# Patient Record
Sex: Female | Born: 1980 | Race: White | Hispanic: No | Marital: Married | Smoking: Former smoker
Health system: Southern US, Community
[De-identification: ages and names within clinical notes are randomized; demographics above are authoritative.]

## PROBLEM LIST (undated history)

## (undated) DIAGNOSIS — F419 Anxiety disorder, unspecified: Secondary | ICD-10-CM

## (undated) DIAGNOSIS — G43909 Migraine, unspecified, not intractable, without status migrainosus: Secondary | ICD-10-CM

## (undated) HISTORY — PX: NO PAST SURGERIES: SHX2092

## (undated) HISTORY — PX: WISDOM TOOTH EXTRACTION: SHX21

---

## 2014-05-21 ENCOUNTER — Other Ambulatory Visit: Payer: Self-pay | Admitting: Physician Assistant

## 2014-05-21 ENCOUNTER — Ambulatory Visit
Admission: RE | Admit: 2014-05-21 | Discharge: 2014-05-21 | Disposition: A | Discharge status: Home / Self Care | Payer: BC Managed Care – PPO | Source: Ambulatory Visit | Attending: Physician Assistant | Admitting: Physician Assistant

## 2014-05-21 DIAGNOSIS — M542 Cervicalgia: Secondary | ICD-10-CM

## 2015-05-02 ENCOUNTER — Other Ambulatory Visit: Payer: Self-pay | Admitting: Pain Medicine

## 2015-05-02 DIAGNOSIS — M545 Low back pain: Secondary | ICD-10-CM

## 2015-05-02 DIAGNOSIS — M542 Cervicalgia: Secondary | ICD-10-CM

## 2015-09-23 DIAGNOSIS — M25512 Pain in left shoulder: Secondary | ICD-10-CM | POA: Diagnosis not present

## 2015-10-09 DIAGNOSIS — M25512 Pain in left shoulder: Secondary | ICD-10-CM | POA: Diagnosis not present

## 2015-10-14 DIAGNOSIS — M25519 Pain in unspecified shoulder: Secondary | ICD-10-CM | POA: Diagnosis not present

## 2015-10-14 DIAGNOSIS — Z79891 Long term (current) use of opiate analgesic: Secondary | ICD-10-CM | POA: Diagnosis not present

## 2015-10-14 DIAGNOSIS — M542 Cervicalgia: Secondary | ICD-10-CM | POA: Diagnosis not present

## 2015-10-14 DIAGNOSIS — G43909 Migraine, unspecified, not intractable, without status migrainosus: Secondary | ICD-10-CM | POA: Diagnosis not present

## 2015-10-14 DIAGNOSIS — G894 Chronic pain syndrome: Secondary | ICD-10-CM | POA: Diagnosis not present

## 2015-10-14 DIAGNOSIS — Z79899 Other long term (current) drug therapy: Secondary | ICD-10-CM | POA: Diagnosis not present

## 2015-10-15 DIAGNOSIS — M25512 Pain in left shoulder: Secondary | ICD-10-CM | POA: Diagnosis not present

## 2015-10-30 DIAGNOSIS — M25512 Pain in left shoulder: Secondary | ICD-10-CM | POA: Diagnosis not present

## 2015-11-11 DIAGNOSIS — M25519 Pain in unspecified shoulder: Secondary | ICD-10-CM | POA: Diagnosis not present

## 2015-11-11 DIAGNOSIS — G894 Chronic pain syndrome: Secondary | ICD-10-CM | POA: Diagnosis not present

## 2015-11-11 DIAGNOSIS — Z79891 Long term (current) use of opiate analgesic: Secondary | ICD-10-CM | POA: Diagnosis not present

## 2015-11-11 DIAGNOSIS — Z79899 Other long term (current) drug therapy: Secondary | ICD-10-CM | POA: Diagnosis not present

## 2015-11-11 DIAGNOSIS — M542 Cervicalgia: Secondary | ICD-10-CM | POA: Diagnosis not present

## 2015-11-11 DIAGNOSIS — M545 Low back pain: Secondary | ICD-10-CM | POA: Diagnosis not present

## 2015-12-11 DIAGNOSIS — Z79891 Long term (current) use of opiate analgesic: Secondary | ICD-10-CM | POA: Diagnosis not present

## 2015-12-11 DIAGNOSIS — M545 Low back pain: Secondary | ICD-10-CM | POA: Diagnosis not present

## 2015-12-11 DIAGNOSIS — R52 Pain, unspecified: Secondary | ICD-10-CM | POA: Diagnosis not present

## 2015-12-11 DIAGNOSIS — M542 Cervicalgia: Secondary | ICD-10-CM | POA: Diagnosis not present

## 2015-12-11 DIAGNOSIS — Z79899 Other long term (current) drug therapy: Secondary | ICD-10-CM | POA: Diagnosis not present

## 2015-12-11 DIAGNOSIS — G894 Chronic pain syndrome: Secondary | ICD-10-CM | POA: Diagnosis not present

## 2015-12-18 DIAGNOSIS — J309 Allergic rhinitis, unspecified: Secondary | ICD-10-CM | POA: Diagnosis not present

## 2015-12-18 DIAGNOSIS — M25549 Pain in joints of unspecified hand: Secondary | ICD-10-CM | POA: Diagnosis not present

## 2015-12-18 DIAGNOSIS — G43909 Migraine, unspecified, not intractable, without status migrainosus: Secondary | ICD-10-CM | POA: Diagnosis not present

## 2016-01-23 DIAGNOSIS — G894 Chronic pain syndrome: Secondary | ICD-10-CM | POA: Diagnosis not present

## 2016-01-23 DIAGNOSIS — M545 Low back pain: Secondary | ICD-10-CM | POA: Diagnosis not present

## 2016-01-23 DIAGNOSIS — G43909 Migraine, unspecified, not intractable, without status migrainosus: Secondary | ICD-10-CM | POA: Diagnosis not present

## 2016-01-23 DIAGNOSIS — M797 Fibromyalgia: Secondary | ICD-10-CM | POA: Diagnosis not present

## 2016-03-31 DIAGNOSIS — G894 Chronic pain syndrome: Secondary | ICD-10-CM | POA: Diagnosis not present

## 2016-03-31 DIAGNOSIS — M2550 Pain in unspecified joint: Secondary | ICD-10-CM | POA: Diagnosis not present

## 2016-03-31 DIAGNOSIS — M791 Myalgia: Secondary | ICD-10-CM | POA: Diagnosis not present

## 2016-05-07 ENCOUNTER — Other Ambulatory Visit (HOSPITAL_COMMUNITY)
Admission: RE | Admit: 2016-05-07 | Discharge: 2016-05-07 | Disposition: A | Discharge status: Home / Self Care | Payer: BLUE CROSS/BLUE SHIELD | Source: Ambulatory Visit | Attending: Obstetrics & Gynecology | Admitting: Obstetrics & Gynecology

## 2016-05-07 ENCOUNTER — Other Ambulatory Visit: Payer: Self-pay | Admitting: Obstetrics & Gynecology

## 2016-05-07 DIAGNOSIS — Z1151 Encounter for screening for human papillomavirus (HPV): Secondary | ICD-10-CM | POA: Insufficient documentation

## 2016-05-07 DIAGNOSIS — Z01419 Encounter for gynecological examination (general) (routine) without abnormal findings: Secondary | ICD-10-CM | POA: Diagnosis not present

## 2016-05-12 LAB — CYTOLOGY - PAP
Diagnosis: NEGATIVE
HPV (WINDOPATH): NOT DETECTED

## 2016-08-13 DIAGNOSIS — G43909 Migraine, unspecified, not intractable, without status migrainosus: Secondary | ICD-10-CM | POA: Diagnosis not present

## 2016-08-13 DIAGNOSIS — F411 Generalized anxiety disorder: Secondary | ICD-10-CM | POA: Diagnosis not present

## 2016-08-13 DIAGNOSIS — G8929 Other chronic pain: Secondary | ICD-10-CM | POA: Diagnosis not present

## 2016-12-10 DIAGNOSIS — T22211A Burn of second degree of right forearm, initial encounter: Secondary | ICD-10-CM | POA: Diagnosis not present

## 2017-07-16 DIAGNOSIS — G43909 Migraine, unspecified, not intractable, without status migrainosus: Secondary | ICD-10-CM | POA: Diagnosis not present

## 2017-07-16 DIAGNOSIS — F411 Generalized anxiety disorder: Secondary | ICD-10-CM | POA: Diagnosis not present

## 2017-10-26 DIAGNOSIS — R21 Rash and other nonspecific skin eruption: Secondary | ICD-10-CM | POA: Diagnosis not present

## 2018-01-14 DIAGNOSIS — G43909 Migraine, unspecified, not intractable, without status migrainosus: Secondary | ICD-10-CM | POA: Diagnosis not present

## 2018-01-14 DIAGNOSIS — F411 Generalized anxiety disorder: Secondary | ICD-10-CM | POA: Diagnosis not present

## 2018-02-02 DIAGNOSIS — M545 Low back pain: Secondary | ICD-10-CM | POA: Diagnosis not present

## 2018-02-08 DIAGNOSIS — M545 Low back pain: Secondary | ICD-10-CM | POA: Diagnosis not present

## 2018-04-28 DIAGNOSIS — M5442 Lumbago with sciatica, left side: Secondary | ICD-10-CM | POA: Diagnosis not present

## 2018-05-26 DIAGNOSIS — M5442 Lumbago with sciatica, left side: Secondary | ICD-10-CM | POA: Diagnosis not present

## 2018-06-07 DIAGNOSIS — M2560 Stiffness of unspecified joint, not elsewhere classified: Secondary | ICD-10-CM | POA: Diagnosis not present

## 2018-06-07 DIAGNOSIS — M545 Low back pain: Secondary | ICD-10-CM | POA: Diagnosis not present

## 2018-06-07 DIAGNOSIS — M799 Soft tissue disorder, unspecified: Secondary | ICD-10-CM | POA: Diagnosis not present

## 2018-06-07 DIAGNOSIS — M5416 Radiculopathy, lumbar region: Secondary | ICD-10-CM | POA: Diagnosis not present

## 2018-06-09 DIAGNOSIS — M2560 Stiffness of unspecified joint, not elsewhere classified: Secondary | ICD-10-CM | POA: Diagnosis not present

## 2018-06-09 DIAGNOSIS — M799 Soft tissue disorder, unspecified: Secondary | ICD-10-CM | POA: Diagnosis not present

## 2018-06-09 DIAGNOSIS — M5416 Radiculopathy, lumbar region: Secondary | ICD-10-CM | POA: Diagnosis not present

## 2018-06-09 DIAGNOSIS — M545 Low back pain: Secondary | ICD-10-CM | POA: Diagnosis not present

## 2018-06-12 DIAGNOSIS — M2560 Stiffness of unspecified joint, not elsewhere classified: Secondary | ICD-10-CM | POA: Diagnosis not present

## 2018-06-12 DIAGNOSIS — M799 Soft tissue disorder, unspecified: Secondary | ICD-10-CM | POA: Diagnosis not present

## 2018-06-12 DIAGNOSIS — M545 Low back pain: Secondary | ICD-10-CM | POA: Diagnosis not present

## 2018-06-12 DIAGNOSIS — M5416 Radiculopathy, lumbar region: Secondary | ICD-10-CM | POA: Diagnosis not present

## 2018-06-17 DIAGNOSIS — M799 Soft tissue disorder, unspecified: Secondary | ICD-10-CM | POA: Diagnosis not present

## 2018-06-17 DIAGNOSIS — M5416 Radiculopathy, lumbar region: Secondary | ICD-10-CM | POA: Diagnosis not present

## 2018-06-17 DIAGNOSIS — M2560 Stiffness of unspecified joint, not elsewhere classified: Secondary | ICD-10-CM | POA: Diagnosis not present

## 2018-06-17 DIAGNOSIS — M545 Low back pain: Secondary | ICD-10-CM | POA: Diagnosis not present

## 2018-06-20 DIAGNOSIS — M5416 Radiculopathy, lumbar region: Secondary | ICD-10-CM | POA: Diagnosis not present

## 2018-06-20 DIAGNOSIS — M799 Soft tissue disorder, unspecified: Secondary | ICD-10-CM | POA: Diagnosis not present

## 2018-06-20 DIAGNOSIS — M545 Low back pain: Secondary | ICD-10-CM | POA: Diagnosis not present

## 2018-06-20 DIAGNOSIS — M2560 Stiffness of unspecified joint, not elsewhere classified: Secondary | ICD-10-CM | POA: Diagnosis not present

## 2018-06-24 DIAGNOSIS — M545 Low back pain: Secondary | ICD-10-CM | POA: Diagnosis not present

## 2018-06-24 DIAGNOSIS — M5416 Radiculopathy, lumbar region: Secondary | ICD-10-CM | POA: Diagnosis not present

## 2018-06-24 DIAGNOSIS — M799 Soft tissue disorder, unspecified: Secondary | ICD-10-CM | POA: Diagnosis not present

## 2018-06-24 DIAGNOSIS — M2560 Stiffness of unspecified joint, not elsewhere classified: Secondary | ICD-10-CM | POA: Diagnosis not present

## 2018-06-29 DIAGNOSIS — M545 Low back pain: Secondary | ICD-10-CM | POA: Diagnosis not present

## 2018-06-29 DIAGNOSIS — M799 Soft tissue disorder, unspecified: Secondary | ICD-10-CM | POA: Diagnosis not present

## 2018-06-29 DIAGNOSIS — M2560 Stiffness of unspecified joint, not elsewhere classified: Secondary | ICD-10-CM | POA: Diagnosis not present

## 2018-06-29 DIAGNOSIS — M5416 Radiculopathy, lumbar region: Secondary | ICD-10-CM | POA: Diagnosis not present

## 2018-07-01 DIAGNOSIS — M2560 Stiffness of unspecified joint, not elsewhere classified: Secondary | ICD-10-CM | POA: Diagnosis not present

## 2018-07-01 DIAGNOSIS — M5416 Radiculopathy, lumbar region: Secondary | ICD-10-CM | POA: Diagnosis not present

## 2018-07-01 DIAGNOSIS — M799 Soft tissue disorder, unspecified: Secondary | ICD-10-CM | POA: Diagnosis not present

## 2018-07-01 DIAGNOSIS — M545 Low back pain: Secondary | ICD-10-CM | POA: Diagnosis not present

## 2018-07-05 DIAGNOSIS — M545 Low back pain: Secondary | ICD-10-CM | POA: Diagnosis not present

## 2018-07-05 DIAGNOSIS — M799 Soft tissue disorder, unspecified: Secondary | ICD-10-CM | POA: Diagnosis not present

## 2018-07-05 DIAGNOSIS — M2560 Stiffness of unspecified joint, not elsewhere classified: Secondary | ICD-10-CM | POA: Diagnosis not present

## 2018-07-05 DIAGNOSIS — M5416 Radiculopathy, lumbar region: Secondary | ICD-10-CM | POA: Diagnosis not present

## 2018-07-07 DIAGNOSIS — M5416 Radiculopathy, lumbar region: Secondary | ICD-10-CM | POA: Diagnosis not present

## 2018-07-07 DIAGNOSIS — M2560 Stiffness of unspecified joint, not elsewhere classified: Secondary | ICD-10-CM | POA: Diagnosis not present

## 2018-07-07 DIAGNOSIS — M799 Soft tissue disorder, unspecified: Secondary | ICD-10-CM | POA: Diagnosis not present

## 2018-07-07 DIAGNOSIS — M545 Low back pain: Secondary | ICD-10-CM | POA: Diagnosis not present

## 2018-07-12 DIAGNOSIS — M545 Low back pain: Secondary | ICD-10-CM | POA: Diagnosis not present

## 2018-07-13 DIAGNOSIS — M2560 Stiffness of unspecified joint, not elsewhere classified: Secondary | ICD-10-CM | POA: Diagnosis not present

## 2018-07-13 DIAGNOSIS — M545 Low back pain: Secondary | ICD-10-CM | POA: Diagnosis not present

## 2018-07-13 DIAGNOSIS — M799 Soft tissue disorder, unspecified: Secondary | ICD-10-CM | POA: Diagnosis not present

## 2018-07-13 DIAGNOSIS — M5416 Radiculopathy, lumbar region: Secondary | ICD-10-CM | POA: Diagnosis not present

## 2018-07-19 DIAGNOSIS — M545 Low back pain: Secondary | ICD-10-CM | POA: Diagnosis not present

## 2018-07-19 DIAGNOSIS — M799 Soft tissue disorder, unspecified: Secondary | ICD-10-CM | POA: Diagnosis not present

## 2018-07-19 DIAGNOSIS — M5416 Radiculopathy, lumbar region: Secondary | ICD-10-CM | POA: Diagnosis not present

## 2018-07-19 DIAGNOSIS — M2560 Stiffness of unspecified joint, not elsewhere classified: Secondary | ICD-10-CM | POA: Diagnosis not present

## 2018-07-21 DIAGNOSIS — M799 Soft tissue disorder, unspecified: Secondary | ICD-10-CM | POA: Diagnosis not present

## 2018-07-21 DIAGNOSIS — M5416 Radiculopathy, lumbar region: Secondary | ICD-10-CM | POA: Diagnosis not present

## 2018-07-21 DIAGNOSIS — M2560 Stiffness of unspecified joint, not elsewhere classified: Secondary | ICD-10-CM | POA: Diagnosis not present

## 2018-07-21 DIAGNOSIS — M545 Low back pain: Secondary | ICD-10-CM | POA: Diagnosis not present

## 2018-07-26 DIAGNOSIS — M545 Low back pain: Secondary | ICD-10-CM | POA: Diagnosis not present

## 2018-07-26 DIAGNOSIS — M2560 Stiffness of unspecified joint, not elsewhere classified: Secondary | ICD-10-CM | POA: Diagnosis not present

## 2018-07-26 DIAGNOSIS — M5416 Radiculopathy, lumbar region: Secondary | ICD-10-CM | POA: Diagnosis not present

## 2018-07-26 DIAGNOSIS — M799 Soft tissue disorder, unspecified: Secondary | ICD-10-CM | POA: Diagnosis not present

## 2018-08-04 DIAGNOSIS — M5416 Radiculopathy, lumbar region: Secondary | ICD-10-CM | POA: Diagnosis not present

## 2018-08-04 DIAGNOSIS — M799 Soft tissue disorder, unspecified: Secondary | ICD-10-CM | POA: Diagnosis not present

## 2018-08-04 DIAGNOSIS — M545 Low back pain: Secondary | ICD-10-CM | POA: Diagnosis not present

## 2018-08-04 DIAGNOSIS — M2560 Stiffness of unspecified joint, not elsewhere classified: Secondary | ICD-10-CM | POA: Diagnosis not present

## 2018-08-10 DIAGNOSIS — M5416 Radiculopathy, lumbar region: Secondary | ICD-10-CM | POA: Diagnosis not present

## 2018-08-10 DIAGNOSIS — M2560 Stiffness of unspecified joint, not elsewhere classified: Secondary | ICD-10-CM | POA: Diagnosis not present

## 2018-08-10 DIAGNOSIS — M545 Low back pain: Secondary | ICD-10-CM | POA: Diagnosis not present

## 2018-08-10 DIAGNOSIS — M799 Soft tissue disorder, unspecified: Secondary | ICD-10-CM | POA: Diagnosis not present

## 2018-08-11 DIAGNOSIS — D2372 Other benign neoplasm of skin of left lower limb, including hip: Secondary | ICD-10-CM | POA: Diagnosis not present

## 2018-08-11 DIAGNOSIS — D2261 Melanocytic nevi of right upper limb, including shoulder: Secondary | ICD-10-CM | POA: Diagnosis not present

## 2018-08-11 DIAGNOSIS — D2271 Melanocytic nevi of right lower limb, including hip: Secondary | ICD-10-CM | POA: Diagnosis not present

## 2018-08-11 DIAGNOSIS — D2262 Melanocytic nevi of left upper limb, including shoulder: Secondary | ICD-10-CM | POA: Diagnosis not present

## 2018-08-12 DIAGNOSIS — M799 Soft tissue disorder, unspecified: Secondary | ICD-10-CM | POA: Diagnosis not present

## 2018-08-12 DIAGNOSIS — M2560 Stiffness of unspecified joint, not elsewhere classified: Secondary | ICD-10-CM | POA: Diagnosis not present

## 2018-08-12 DIAGNOSIS — M545 Low back pain: Secondary | ICD-10-CM | POA: Diagnosis not present

## 2018-08-12 DIAGNOSIS — M5416 Radiculopathy, lumbar region: Secondary | ICD-10-CM | POA: Diagnosis not present

## 2018-08-23 DIAGNOSIS — G43909 Migraine, unspecified, not intractable, without status migrainosus: Secondary | ICD-10-CM | POA: Diagnosis not present

## 2018-08-23 DIAGNOSIS — F411 Generalized anxiety disorder: Secondary | ICD-10-CM | POA: Diagnosis not present

## 2018-08-23 DIAGNOSIS — J0100 Acute maxillary sinusitis, unspecified: Secondary | ICD-10-CM | POA: Diagnosis not present

## 2018-08-23 DIAGNOSIS — J309 Allergic rhinitis, unspecified: Secondary | ICD-10-CM | POA: Diagnosis not present

## 2019-01-02 DIAGNOSIS — R102 Pelvic and perineal pain: Secondary | ICD-10-CM | POA: Diagnosis not present

## 2019-01-02 DIAGNOSIS — Z30011 Encounter for initial prescription of contraceptive pills: Secondary | ICD-10-CM | POA: Diagnosis not present

## 2019-01-02 DIAGNOSIS — Z01411 Encounter for gynecological examination (general) (routine) with abnormal findings: Secondary | ICD-10-CM | POA: Diagnosis not present

## 2019-03-09 DIAGNOSIS — M5442 Lumbago with sciatica, left side: Secondary | ICD-10-CM | POA: Diagnosis not present

## 2019-03-20 DIAGNOSIS — Z23 Encounter for immunization: Secondary | ICD-10-CM | POA: Diagnosis not present

## 2019-03-28 DIAGNOSIS — Z0000 Encounter for general adult medical examination without abnormal findings: Secondary | ICD-10-CM | POA: Diagnosis not present

## 2019-11-23 DIAGNOSIS — D2372 Other benign neoplasm of skin of left lower limb, including hip: Secondary | ICD-10-CM | POA: Diagnosis not present

## 2019-12-07 DIAGNOSIS — M545 Low back pain: Secondary | ICD-10-CM | POA: Diagnosis not present

## 2019-12-18 DIAGNOSIS — M545 Low back pain: Secondary | ICD-10-CM | POA: Diagnosis not present

## 2019-12-18 DIAGNOSIS — M2569 Stiffness of other specified joint, not elsewhere classified: Secondary | ICD-10-CM | POA: Diagnosis not present

## 2019-12-18 DIAGNOSIS — R531 Weakness: Secondary | ICD-10-CM | POA: Diagnosis not present

## 2019-12-18 DIAGNOSIS — R293 Abnormal posture: Secondary | ICD-10-CM | POA: Diagnosis not present

## 2019-12-26 ENCOUNTER — Encounter (HOSPITAL_BASED_OUTPATIENT_CLINIC_OR_DEPARTMENT_OTHER): Payer: Self-pay

## 2019-12-26 ENCOUNTER — Other Ambulatory Visit: Payer: Self-pay

## 2019-12-26 ENCOUNTER — Emergency Department (HOSPITAL_BASED_OUTPATIENT_CLINIC_OR_DEPARTMENT_OTHER)
Admission: EM | Admit: 2019-12-26 | Discharge: 2019-12-26 | Disposition: A | Discharge status: Home / Self Care | Payer: BC Managed Care – PPO | Attending: Emergency Medicine | Admitting: Emergency Medicine

## 2019-12-26 DIAGNOSIS — R519 Headache, unspecified: Secondary | ICD-10-CM | POA: Diagnosis not present

## 2019-12-26 DIAGNOSIS — G43909 Migraine, unspecified, not intractable, without status migrainosus: Secondary | ICD-10-CM

## 2019-12-26 DIAGNOSIS — R030 Elevated blood-pressure reading, without diagnosis of hypertension: Secondary | ICD-10-CM | POA: Diagnosis not present

## 2019-12-26 HISTORY — DX: Migraine, unspecified, not intractable, without status migrainosus: G43.909

## 2019-12-26 LAB — BASIC METABOLIC PANEL
Anion gap: 10 (ref 5–15)
BUN: 11 mg/dL (ref 6–20)
CO2: 22 mmol/L (ref 22–32)
Calcium: 8.5 mg/dL — ABNORMAL LOW (ref 8.9–10.3)
Chloride: 103 mmol/L (ref 98–111)
Creatinine, Ser: 0.48 mg/dL (ref 0.44–1.00)
GFR calc Af Amer: >60 mL/min (ref >60)
GFR calc non Af Amer: >60 mL/min (ref >60)
Glucose, Bld: 97 mg/dL (ref 70–99)
Potassium: 3.5 mmol/L (ref 3.5–5.1)
Sodium: 135 mmol/L (ref 135–145)

## 2019-12-26 LAB — CBC WITH DIFFERENTIAL/PLATELET
Abs Immature Granulocytes: 0.02 10*3/uL (ref 0.00–0.07)
Basophils Absolute: 0.1 10*3/uL (ref 0.0–0.1)
Basophils Relative: 1 %
Eosinophils Absolute: 0.1 10*3/uL (ref 0.0–0.5)
Eosinophils Relative: 1 %
HCT: 41.6 % (ref 36.0–46.0)
Hemoglobin: 13.2 g/dL (ref 12.0–15.0)
Immature Granulocytes: 0 %
Lymphocytes Relative: 12 %
Lymphs Abs: 1 10*3/uL (ref 0.7–4.0)
MCH: 31.8 pg (ref 26.0–34.0)
MCHC: 31.7 g/dL (ref 30.0–36.0)
MCV: 100.2 fL — ABNORMAL HIGH (ref 80.0–100.0)
Monocytes Absolute: 0.5 10*3/uL (ref 0.1–1.0)
Monocytes Relative: 5 %
Neutro Abs: 7.3 10*3/uL (ref 1.7–7.7)
Neutrophils Relative %: 81 %
Platelets: 282 10*3/uL (ref 150–400)
RBC: 4.15 MIL/uL (ref 3.87–5.11)
RDW: 12.7 % (ref 11.5–15.5)
WBC: 9 10*3/uL (ref 4.0–10.5)
nRBC: 0 % (ref 0.0–0.2)

## 2019-12-26 LAB — URINALYSIS, ROUTINE W REFLEX MICROSCOPIC
Bilirubin Urine: NEGATIVE
Glucose, UA: NEGATIVE mg/dL
Ketones, ur: NEGATIVE mg/dL
Leukocytes,Ua: NEGATIVE
Nitrite: NEGATIVE
Protein, ur: NEGATIVE mg/dL
Specific Gravity, Urine: 1.025 (ref 1.005–1.030)
pH: 8.5 — ABNORMAL HIGH (ref 5.0–8.0)

## 2019-12-26 LAB — PREGNANCY, URINE: Preg Test, Ur: NEGATIVE

## 2019-12-26 LAB — URINALYSIS, MICROSCOPIC (REFLEX)

## 2019-12-26 MED ORDER — KETOROLAC TROMETHAMINE 30 MG/ML IJ SOLN
30.0000 mg | Freq: Once | INTRAMUSCULAR | Status: AC
  Administered 2019-12-26: 30 mg via INTRAVENOUS

## 2019-12-26 MED ORDER — SODIUM CHLORIDE 0.9 % IV BOLUS
1000.0000 mL | Freq: Once | INTRAVENOUS | Status: AC
  Administered 2019-12-26: 1000 mL via INTRAVENOUS

## 2019-12-26 MED ORDER — DIPHENHYDRAMINE HCL 50 MG/ML IJ SOLN
50.0000 mg | Freq: Once | INTRAMUSCULAR | Status: AC
  Administered 2019-12-26: 50 mg via INTRAVENOUS
  Filled 2019-12-26: qty 1

## 2019-12-26 MED ORDER — KETOROLAC TROMETHAMINE 30 MG/ML IJ SOLN
30.0000 mg | Freq: Once | INTRAMUSCULAR | Status: DC
  Filled 2019-12-26: qty 1

## 2019-12-26 MED ORDER — PROCHLORPERAZINE EDISYLATE 10 MG/2ML IJ SOLN
10.0000 mg | Freq: Once | INTRAMUSCULAR | Status: AC
  Administered 2019-12-26: 10 mg via INTRAVENOUS
  Filled 2019-12-26: qty 2

## 2019-12-26 NOTE — Discharge Instructions (Addendum)
Your labwork was reassuring today. Your headache was treated in the ED which subsequently brought your blood pressure down. Please follow up with your PCP regarding your ED visit today and discuss medication options for your migraines.   Return to the ED for any worsening symptoms including worsening pain, excessive vomiting, fevers > 100.4, blurry vision/double vision, neck stiffness, rash, or any other new/concerning symptoms.

## 2019-12-26 NOTE — ED Provider Notes (Signed)
[facility] EMERGENCY DEPARTMENT Provider Note   CSN: [id number] Arrival date & time: 12/26/19  1407     History Chief Complaint  Patient presents with  . Migraine    Linda Moody is a 39 y.o. female with PMHx migraine who presents to the ED today with complaint of gradual onset, constant, pressure like headache to right side x 5 days. Pt also complains of photophobia, phonophobia, and nausea. She has been taking Sumatriptan without relief prompting her to go to her PCP today. She was noted to have an elevated BP and told to come to the ED for further evaluation. Pt denies hx of HTN and is not on any medication for it. BP in the ED 169/104 with recheck 169/94. Pt states her headache feels similar to previous migraines however it has never persisted this long. Pt denies fevers, chills, neck stiffness, rash, vision changes, speech changes, unilateral weakness or numbness, or any other associated symptoms.   The history is provided by the patient and medical records.       Past Medical History:  Diagnosis Date  . Migraine     There are no problems to display for this patient.   History reviewed. No pertinent surgical history.   OB History   No obstetric history on file.     No family history on file.  Social History   Tobacco Use  . Smoking status: Never Smoker  . Smokeless tobacco: Never Used  Vaping Use  . Vaping Use: Never used  Substance Use Topics  . Alcohol use: Yes    Comment: daily  . Drug use: Never    Home Medications Prior to Admission medications   Medication Sig Start Date End Date Taking? Authorizing Provider  clonazePAM (KLONOPIN) 0.5 MG tablet Take 0.5 mg by mouth 2 (two) times daily as needed for anxiety.   Yes [provider]  FLUoxetine (PROZAC) 10 MG tablet Take 10 mg by mouth daily.   Yes [provider]  SUMAtriptan (IMITREX) 50 MG tablet Take 50 mg by mouth every 2 (two) hours as needed for migraine. May repeat in  2 hours if headache persists or recurs.   Yes [provider]    Allergies    Aspirin and Penicillins  Review of Systems   Review of Systems  Constitutional: Negative for chills and fever.  Eyes: Positive for photophobia. Negative for visual disturbance.  Gastrointestinal: Positive for nausea. Negative for vomiting.  Neurological: Positive for headaches. Negative for dizziness, syncope and light-headedness.  All other systems reviewed and are negative.   Physical Exam Updated Vital Signs BP 132/87   Pulse 86   Temp 98 F (36.7 C) (Oral)   Resp 18   Ht 5\' 4"  (1.626 m)   Wt 57.2 kg   SpO2 100%   BMI 21.63 kg/m   Physical Exam Vitals and nursing note reviewed.  Constitutional:      Appearance: She is not ill-appearing or diaphoretic.  HENT:     Head: Normocephalic and atraumatic.  Eyes:     Extraocular Movements: Extraocular movements intact.     Conjunctiva/sclera: Conjunctivae normal.     Pupils: Pupils are equal, round, and reactive to light.  Cardiovascular:     Rate and Rhythm: Normal rate and regular rhythm.     Pulses: Normal pulses.  Pulmonary:     Effort: Pulmonary effort is normal.     Breath sounds: Normal breath sounds. No wheezing, rhonchi or rales.  Abdominal:  Palpations: Abdomen is soft.     Tenderness: There is no abdominal tenderness.  Musculoskeletal:     Cervical back: Neck supple.  Skin:    General: Skin is warm and dry.  Neurological:     Mental Status: She is alert.     Comments: CN 3-12 grossly intact A&O x4 GCS 15 Sensation and strength intact Gait nonataxic including with tandem walking Coordination with finger-to-nose WNL Neg romberg, neg pronator drift     ED Results / Procedures / Treatments   Labs (all labs ordered are listed, but only abnormal results are displayed) Labs Reviewed  BASIC METABOLIC PANEL - Abnormal; Notable for the following components:      Result Value   Calcium 8.5 (*)    All other  components within normal limits  CBC WITH DIFFERENTIAL/PLATELET - Abnormal; Notable for the following components:   MCV 100.2 (*)    All other components within normal limits  URINALYSIS, ROUTINE W REFLEX MICROSCOPIC - Abnormal; Notable for the following components:   pH 8.5 (*)    Hgb urine dipstick SMALL (*)    All other components within normal limits  URINALYSIS, MICROSCOPIC (REFLEX) - Abnormal; Notable for the following components:   Bacteria, UA RARE (*)    All other components within normal limits  PREGNANCY, URINE    EKG None  Radiology No results found.  Procedures Procedures (including critical care time)  Medications Ordered in ED Medications  sodium chloride 0.9 % bolus 1,000 mL (0 mLs Intravenous Stopped 12/26/19 1639)  prochlorperazine (COMPAZINE) injection 10 mg (10 mg Intravenous Given 12/26/19 1551)  diphenhydrAMINE (BENADRYL) injection 50 mg (50 mg Intravenous Given 12/26/19 1547)  ketorolac (TORADOL) 30 MG/ML injection 30 mg (30 mg Intravenous Given 12/26/19 1550)    ED Course  I have reviewed the triage vital signs and the nursing notes.  Pertinent labs & imaging results that were available during my care of the patient were reviewed by me and considered in my medical decision making (see chart for details).  Clinical Course as of Dec 25 1720  Tue Dec 26, 2019  1654 Pulse Rate: 86 [MV]  1654 BP: 132/87 [MV]    Clinical Course User Index [MV] Tanda Rockers, PA-C   MDM Rules/Calculators/A&P                          39 year old female who presents to the ED today after being sent from PCPs office for elevated blood pressure.  Initially presented to the PCP office for persistent migraine headache for the past 5 days.  Patient does not have a history of high blood pressure.  Blood pressure on arrival to the ED 168/104 recheck 169/94.  Patient has no focal neuro deficits on exam today.  No meningeal signs.  I suspect her blood pressure may be elevated due to her  persistent headache however will obtain screening labs at this time.  We will plan to provide headache cocktail however will check for pregnancy prior to this. Given pt with hx of migraines and this feeling similar I do not think pt needs a CT head at this time. Very low suspicion for [facility] or other intracranial abnormality.   Urine preg negative. Toradol, Benadryl, and Compazine as well as fluids ordered.   CBC without leukocytosis. Hgb stable.  BMP without electrolyte abnormalities.  U/A with small hgb and rare bacteria. No leuks or nitrites. Pt without urinary complaints at this time.   On  reevaluation pt's HR has improved in the 80s and BP 132/87. Pt reports her headache has improved significantly. Will discharge home at this time. Have encouraged pt to follow up with her PCP to discuss management of her migraines as it appears imitrex is no longer helping. Strict return precautions discussed with pt. She is in agreement with plan and stable for discharge home.   This note was prepared using Dragon voice recognition software and may include unintentional dictation errors due to the inherent limitations of voice recognition software.  Final Clinical Impression(s) / ED Diagnoses Final diagnoses:  Migraine without status migrainosus, not intractable, unspecified migraine type  Elevated blood pressure reading    Rx / DC Orders ED Discharge Orders    None       Discharge Instructions     Your labwork was reassuring today. Your headache was treated in the ED which subsequently brought your blood pressure down. Please follow up with your PCP regarding your ED visit today and discuss medication options for your migraines.   Return to the ED for any worsening symptoms including worsening pain, excessive vomiting, fevers > 100.4, blurry vision/double vision, neck stiffness, rash, or any other new/concerning symptoms.        Tanda Rockers, PA-C 12/26/19 1724    Vanetta Mulders,  MD 12/27/19 1616

## 2019-12-26 NOTE — ED Notes (Signed)
ED Provider at bedside. 

## 2019-12-26 NOTE — ED Triage Notes (Addendum)
Pt c/o migraine x 4 days-c/o elevated BP x 3 days-NAD-steady gait

## 2019-12-26 NOTE — ED Notes (Signed)
Headache for several days. Normal migraine meds not working. She was her PCP today and BP was high. No hx of HTN.

## 2019-12-27 DIAGNOSIS — G43909 Migraine, unspecified, not intractable, without status migrainosus: Secondary | ICD-10-CM | POA: Diagnosis not present

## 2019-12-27 DIAGNOSIS — R030 Elevated blood-pressure reading, without diagnosis of hypertension: Secondary | ICD-10-CM | POA: Diagnosis not present

## 2019-12-28 DIAGNOSIS — M2569 Stiffness of other specified joint, not elsewhere classified: Secondary | ICD-10-CM | POA: Diagnosis not present

## 2019-12-28 DIAGNOSIS — M545 Low back pain: Secondary | ICD-10-CM | POA: Diagnosis not present

## 2019-12-28 DIAGNOSIS — R531 Weakness: Secondary | ICD-10-CM | POA: Diagnosis not present

## 2019-12-28 DIAGNOSIS — R293 Abnormal posture: Secondary | ICD-10-CM | POA: Diagnosis not present

## 2020-01-05 DIAGNOSIS — R293 Abnormal posture: Secondary | ICD-10-CM | POA: Diagnosis not present

## 2020-01-05 DIAGNOSIS — M2569 Stiffness of other specified joint, not elsewhere classified: Secondary | ICD-10-CM | POA: Diagnosis not present

## 2020-01-05 DIAGNOSIS — R531 Weakness: Secondary | ICD-10-CM | POA: Diagnosis not present

## 2020-01-05 DIAGNOSIS — M545 Low back pain: Secondary | ICD-10-CM | POA: Diagnosis not present

## 2020-01-08 DIAGNOSIS — R293 Abnormal posture: Secondary | ICD-10-CM | POA: Diagnosis not present

## 2020-01-08 DIAGNOSIS — R531 Weakness: Secondary | ICD-10-CM | POA: Diagnosis not present

## 2020-01-08 DIAGNOSIS — M545 Low back pain: Secondary | ICD-10-CM | POA: Diagnosis not present

## 2020-01-08 DIAGNOSIS — M2569 Stiffness of other specified joint, not elsewhere classified: Secondary | ICD-10-CM | POA: Diagnosis not present

## 2020-01-09 DIAGNOSIS — D2372 Other benign neoplasm of skin of left lower limb, including hip: Secondary | ICD-10-CM | POA: Diagnosis not present

## 2020-01-16 DIAGNOSIS — M2569 Stiffness of other specified joint, not elsewhere classified: Secondary | ICD-10-CM | POA: Diagnosis not present

## 2020-01-16 DIAGNOSIS — M545 Low back pain: Secondary | ICD-10-CM | POA: Diagnosis not present

## 2020-01-16 DIAGNOSIS — R531 Weakness: Secondary | ICD-10-CM | POA: Diagnosis not present

## 2020-01-16 DIAGNOSIS — R293 Abnormal posture: Secondary | ICD-10-CM | POA: Diagnosis not present

## 2020-01-18 DIAGNOSIS — R293 Abnormal posture: Secondary | ICD-10-CM | POA: Diagnosis not present

## 2020-01-18 DIAGNOSIS — M2569 Stiffness of other specified joint, not elsewhere classified: Secondary | ICD-10-CM | POA: Diagnosis not present

## 2020-01-18 DIAGNOSIS — M545 Low back pain: Secondary | ICD-10-CM | POA: Diagnosis not present

## 2020-01-18 DIAGNOSIS — R531 Weakness: Secondary | ICD-10-CM | POA: Diagnosis not present

## 2020-01-22 DIAGNOSIS — M2569 Stiffness of other specified joint, not elsewhere classified: Secondary | ICD-10-CM | POA: Diagnosis not present

## 2020-01-22 DIAGNOSIS — R531 Weakness: Secondary | ICD-10-CM | POA: Diagnosis not present

## 2020-01-22 DIAGNOSIS — M545 Low back pain: Secondary | ICD-10-CM | POA: Diagnosis not present

## 2020-01-22 DIAGNOSIS — R293 Abnormal posture: Secondary | ICD-10-CM | POA: Diagnosis not present

## 2020-01-25 DIAGNOSIS — G43011 Migraine without aura, intractable, with status migrainosus: Secondary | ICD-10-CM | POA: Diagnosis not present

## 2020-01-29 DIAGNOSIS — R293 Abnormal posture: Secondary | ICD-10-CM | POA: Diagnosis not present

## 2020-01-29 DIAGNOSIS — R531 Weakness: Secondary | ICD-10-CM | POA: Diagnosis not present

## 2020-01-29 DIAGNOSIS — G43821 Menstrual migraine, not intractable, with status migrainosus: Secondary | ICD-10-CM | POA: Diagnosis not present

## 2020-01-29 DIAGNOSIS — Z3009 Encounter for other general counseling and advice on contraception: Secondary | ICD-10-CM | POA: Diagnosis not present

## 2020-01-29 DIAGNOSIS — M545 Low back pain: Secondary | ICD-10-CM | POA: Diagnosis not present

## 2020-01-29 DIAGNOSIS — M2569 Stiffness of other specified joint, not elsewhere classified: Secondary | ICD-10-CM | POA: Diagnosis not present

## 2020-01-31 DIAGNOSIS — R531 Weakness: Secondary | ICD-10-CM | POA: Diagnosis not present

## 2020-01-31 DIAGNOSIS — M2569 Stiffness of other specified joint, not elsewhere classified: Secondary | ICD-10-CM | POA: Diagnosis not present

## 2020-01-31 DIAGNOSIS — R293 Abnormal posture: Secondary | ICD-10-CM | POA: Diagnosis not present

## 2020-01-31 DIAGNOSIS — M545 Low back pain: Secondary | ICD-10-CM | POA: Diagnosis not present

## 2020-02-05 DIAGNOSIS — R293 Abnormal posture: Secondary | ICD-10-CM | POA: Diagnosis not present

## 2020-02-05 DIAGNOSIS — M2569 Stiffness of other specified joint, not elsewhere classified: Secondary | ICD-10-CM | POA: Diagnosis not present

## 2020-02-05 DIAGNOSIS — R531 Weakness: Secondary | ICD-10-CM | POA: Diagnosis not present

## 2020-02-05 DIAGNOSIS — M545 Low back pain: Secondary | ICD-10-CM | POA: Diagnosis not present

## 2020-02-06 DIAGNOSIS — Z30017 Encounter for initial prescription of implantable subdermal contraceptive: Secondary | ICD-10-CM | POA: Diagnosis not present

## 2020-02-06 DIAGNOSIS — Z0000 Encounter for general adult medical examination without abnormal findings: Secondary | ICD-10-CM | POA: Diagnosis not present

## 2020-02-06 DIAGNOSIS — Z3202 Encounter for pregnancy test, result negative: Secondary | ICD-10-CM | POA: Diagnosis not present

## 2020-02-07 DIAGNOSIS — R293 Abnormal posture: Secondary | ICD-10-CM | POA: Diagnosis not present

## 2020-02-07 DIAGNOSIS — R531 Weakness: Secondary | ICD-10-CM | POA: Diagnosis not present

## 2020-02-07 DIAGNOSIS — M2569 Stiffness of other specified joint, not elsewhere classified: Secondary | ICD-10-CM | POA: Diagnosis not present

## 2020-02-07 DIAGNOSIS — M545 Low back pain: Secondary | ICD-10-CM | POA: Diagnosis not present

## 2020-02-13 DIAGNOSIS — M2569 Stiffness of other specified joint, not elsewhere classified: Secondary | ICD-10-CM | POA: Diagnosis not present

## 2020-02-13 DIAGNOSIS — R293 Abnormal posture: Secondary | ICD-10-CM | POA: Diagnosis not present

## 2020-02-13 DIAGNOSIS — R531 Weakness: Secondary | ICD-10-CM | POA: Diagnosis not present

## 2020-02-13 DIAGNOSIS — M545 Low back pain: Secondary | ICD-10-CM | POA: Diagnosis not present

## 2020-02-20 DIAGNOSIS — R293 Abnormal posture: Secondary | ICD-10-CM | POA: Diagnosis not present

## 2020-02-20 DIAGNOSIS — M545 Low back pain: Secondary | ICD-10-CM | POA: Diagnosis not present

## 2020-02-20 DIAGNOSIS — R531 Weakness: Secondary | ICD-10-CM | POA: Diagnosis not present

## 2020-02-20 DIAGNOSIS — M2569 Stiffness of other specified joint, not elsewhere classified: Secondary | ICD-10-CM | POA: Diagnosis not present

## 2020-02-22 DIAGNOSIS — M545 Low back pain: Secondary | ICD-10-CM | POA: Diagnosis not present

## 2020-02-22 DIAGNOSIS — R531 Weakness: Secondary | ICD-10-CM | POA: Diagnosis not present

## 2020-02-22 DIAGNOSIS — R293 Abnormal posture: Secondary | ICD-10-CM | POA: Diagnosis not present

## 2020-02-22 DIAGNOSIS — M2569 Stiffness of other specified joint, not elsewhere classified: Secondary | ICD-10-CM | POA: Diagnosis not present

## 2020-02-27 DIAGNOSIS — R293 Abnormal posture: Secondary | ICD-10-CM | POA: Diagnosis not present

## 2020-02-27 DIAGNOSIS — M545 Low back pain: Secondary | ICD-10-CM | POA: Diagnosis not present

## 2020-02-27 DIAGNOSIS — R531 Weakness: Secondary | ICD-10-CM | POA: Diagnosis not present

## 2020-02-27 DIAGNOSIS — M2569 Stiffness of other specified joint, not elsewhere classified: Secondary | ICD-10-CM | POA: Diagnosis not present

## 2020-02-29 DIAGNOSIS — M2569 Stiffness of other specified joint, not elsewhere classified: Secondary | ICD-10-CM | POA: Diagnosis not present

## 2020-02-29 DIAGNOSIS — R293 Abnormal posture: Secondary | ICD-10-CM | POA: Diagnosis not present

## 2020-02-29 DIAGNOSIS — M545 Low back pain: Secondary | ICD-10-CM | POA: Diagnosis not present

## 2020-02-29 DIAGNOSIS — R531 Weakness: Secondary | ICD-10-CM | POA: Diagnosis not present

## 2020-03-05 DIAGNOSIS — R293 Abnormal posture: Secondary | ICD-10-CM | POA: Diagnosis not present

## 2020-03-05 DIAGNOSIS — R531 Weakness: Secondary | ICD-10-CM | POA: Diagnosis not present

## 2020-03-05 DIAGNOSIS — M545 Low back pain: Secondary | ICD-10-CM | POA: Diagnosis not present

## 2020-03-05 DIAGNOSIS — M2569 Stiffness of other specified joint, not elsewhere classified: Secondary | ICD-10-CM | POA: Diagnosis not present

## 2020-03-07 DIAGNOSIS — R293 Abnormal posture: Secondary | ICD-10-CM | POA: Diagnosis not present

## 2020-03-07 DIAGNOSIS — M2569 Stiffness of other specified joint, not elsewhere classified: Secondary | ICD-10-CM | POA: Diagnosis not present

## 2020-03-07 DIAGNOSIS — R531 Weakness: Secondary | ICD-10-CM | POA: Diagnosis not present

## 2020-03-07 DIAGNOSIS — M545 Low back pain: Secondary | ICD-10-CM | POA: Diagnosis not present

## 2020-03-12 DIAGNOSIS — R531 Weakness: Secondary | ICD-10-CM | POA: Diagnosis not present

## 2020-03-12 DIAGNOSIS — R293 Abnormal posture: Secondary | ICD-10-CM | POA: Diagnosis not present

## 2020-03-12 DIAGNOSIS — M2569 Stiffness of other specified joint, not elsewhere classified: Secondary | ICD-10-CM | POA: Diagnosis not present

## 2020-03-12 DIAGNOSIS — M545 Low back pain: Secondary | ICD-10-CM | POA: Diagnosis not present

## 2020-03-14 DIAGNOSIS — R293 Abnormal posture: Secondary | ICD-10-CM | POA: Diagnosis not present

## 2020-03-14 DIAGNOSIS — M545 Low back pain: Secondary | ICD-10-CM | POA: Diagnosis not present

## 2020-03-14 DIAGNOSIS — M2569 Stiffness of other specified joint, not elsewhere classified: Secondary | ICD-10-CM | POA: Diagnosis not present

## 2020-03-14 DIAGNOSIS — R531 Weakness: Secondary | ICD-10-CM | POA: Diagnosis not present

## 2020-03-19 DIAGNOSIS — R531 Weakness: Secondary | ICD-10-CM | POA: Diagnosis not present

## 2020-03-19 DIAGNOSIS — M545 Low back pain: Secondary | ICD-10-CM | POA: Diagnosis not present

## 2020-03-19 DIAGNOSIS — M2569 Stiffness of other specified joint, not elsewhere classified: Secondary | ICD-10-CM | POA: Diagnosis not present

## 2020-03-19 DIAGNOSIS — R293 Abnormal posture: Secondary | ICD-10-CM | POA: Diagnosis not present

## 2020-03-21 DIAGNOSIS — R531 Weakness: Secondary | ICD-10-CM | POA: Diagnosis not present

## 2020-03-21 DIAGNOSIS — R293 Abnormal posture: Secondary | ICD-10-CM | POA: Diagnosis not present

## 2020-03-21 DIAGNOSIS — M2569 Stiffness of other specified joint, not elsewhere classified: Secondary | ICD-10-CM | POA: Diagnosis not present

## 2020-03-21 DIAGNOSIS — M545 Low back pain: Secondary | ICD-10-CM | POA: Diagnosis not present

## 2020-03-26 DIAGNOSIS — R293 Abnormal posture: Secondary | ICD-10-CM | POA: Diagnosis not present

## 2020-03-26 DIAGNOSIS — M545 Low back pain, unspecified: Secondary | ICD-10-CM | POA: Diagnosis not present

## 2020-03-26 DIAGNOSIS — M2569 Stiffness of other specified joint, not elsewhere classified: Secondary | ICD-10-CM | POA: Diagnosis not present

## 2020-03-26 DIAGNOSIS — R531 Weakness: Secondary | ICD-10-CM | POA: Diagnosis not present

## 2020-03-28 DIAGNOSIS — Z0000 Encounter for general adult medical examination without abnormal findings: Secondary | ICD-10-CM | POA: Diagnosis not present

## 2020-03-28 DIAGNOSIS — R350 Frequency of micturition: Secondary | ICD-10-CM | POA: Diagnosis not present

## 2020-03-29 DIAGNOSIS — M545 Low back pain, unspecified: Secondary | ICD-10-CM | POA: Diagnosis not present

## 2020-03-29 DIAGNOSIS — R293 Abnormal posture: Secondary | ICD-10-CM | POA: Diagnosis not present

## 2020-03-29 DIAGNOSIS — M2569 Stiffness of other specified joint, not elsewhere classified: Secondary | ICD-10-CM | POA: Diagnosis not present

## 2020-03-29 DIAGNOSIS — R531 Weakness: Secondary | ICD-10-CM | POA: Diagnosis not present

## 2020-04-02 DIAGNOSIS — M2569 Stiffness of other specified joint, not elsewhere classified: Secondary | ICD-10-CM | POA: Diagnosis not present

## 2020-04-02 DIAGNOSIS — R293 Abnormal posture: Secondary | ICD-10-CM | POA: Diagnosis not present

## 2020-04-02 DIAGNOSIS — M545 Low back pain, unspecified: Secondary | ICD-10-CM | POA: Diagnosis not present

## 2020-04-02 DIAGNOSIS — R531 Weakness: Secondary | ICD-10-CM | POA: Diagnosis not present

## 2020-04-04 DIAGNOSIS — R293 Abnormal posture: Secondary | ICD-10-CM | POA: Diagnosis not present

## 2020-04-04 DIAGNOSIS — M2569 Stiffness of other specified joint, not elsewhere classified: Secondary | ICD-10-CM | POA: Diagnosis not present

## 2020-04-04 DIAGNOSIS — M545 Low back pain, unspecified: Secondary | ICD-10-CM | POA: Diagnosis not present

## 2020-04-04 DIAGNOSIS — R531 Weakness: Secondary | ICD-10-CM | POA: Diagnosis not present

## 2020-04-09 DIAGNOSIS — M545 Low back pain, unspecified: Secondary | ICD-10-CM | POA: Diagnosis not present

## 2020-04-09 DIAGNOSIS — R293 Abnormal posture: Secondary | ICD-10-CM | POA: Diagnosis not present

## 2020-04-09 DIAGNOSIS — R531 Weakness: Secondary | ICD-10-CM | POA: Diagnosis not present

## 2020-04-09 DIAGNOSIS — M2569 Stiffness of other specified joint, not elsewhere classified: Secondary | ICD-10-CM | POA: Diagnosis not present

## 2020-04-19 DIAGNOSIS — M545 Low back pain, unspecified: Secondary | ICD-10-CM | POA: Diagnosis not present

## 2020-04-19 DIAGNOSIS — R293 Abnormal posture: Secondary | ICD-10-CM | POA: Diagnosis not present

## 2020-04-19 DIAGNOSIS — R531 Weakness: Secondary | ICD-10-CM | POA: Diagnosis not present

## 2020-04-19 DIAGNOSIS — M2569 Stiffness of other specified joint, not elsewhere classified: Secondary | ICD-10-CM | POA: Diagnosis not present

## 2020-04-24 DIAGNOSIS — R531 Weakness: Secondary | ICD-10-CM | POA: Diagnosis not present

## 2020-04-24 DIAGNOSIS — M545 Low back pain, unspecified: Secondary | ICD-10-CM | POA: Diagnosis not present

## 2020-04-24 DIAGNOSIS — R293 Abnormal posture: Secondary | ICD-10-CM | POA: Diagnosis not present

## 2020-04-24 DIAGNOSIS — M2569 Stiffness of other specified joint, not elsewhere classified: Secondary | ICD-10-CM | POA: Diagnosis not present

## 2020-05-01 DIAGNOSIS — R531 Weakness: Secondary | ICD-10-CM | POA: Diagnosis not present

## 2020-05-01 DIAGNOSIS — M545 Low back pain, unspecified: Secondary | ICD-10-CM | POA: Diagnosis not present

## 2020-05-01 DIAGNOSIS — R293 Abnormal posture: Secondary | ICD-10-CM | POA: Diagnosis not present

## 2020-05-01 DIAGNOSIS — M2569 Stiffness of other specified joint, not elsewhere classified: Secondary | ICD-10-CM | POA: Diagnosis not present

## 2020-05-11 DIAGNOSIS — M545 Low back pain, unspecified: Secondary | ICD-10-CM | POA: Diagnosis not present

## 2020-05-27 DIAGNOSIS — M5126 Other intervertebral disc displacement, lumbar region: Secondary | ICD-10-CM | POA: Diagnosis not present

## 2020-05-30 DIAGNOSIS — Z13220 Encounter for screening for lipoid disorders: Secondary | ICD-10-CM | POA: Diagnosis not present

## 2020-05-30 DIAGNOSIS — Z0000 Encounter for general adult medical examination without abnormal findings: Secondary | ICD-10-CM | POA: Diagnosis not present

## 2020-05-31 DIAGNOSIS — M5416 Radiculopathy, lumbar region: Secondary | ICD-10-CM | POA: Diagnosis not present

## 2020-06-03 DIAGNOSIS — G8929 Other chronic pain: Secondary | ICD-10-CM | POA: Diagnosis not present

## 2020-06-03 DIAGNOSIS — M545 Low back pain, unspecified: Secondary | ICD-10-CM | POA: Diagnosis not present

## 2020-06-07 ENCOUNTER — Ambulatory Visit: Payer: Self-pay | Admitting: Orthopedic Surgery

## 2020-06-10 DIAGNOSIS — M5459 Other low back pain: Secondary | ICD-10-CM | POA: Diagnosis not present

## 2020-06-11 ENCOUNTER — Encounter (HOSPITAL_COMMUNITY): Payer: Self-pay

## 2020-06-11 ENCOUNTER — Ambulatory Visit (HOSPITAL_COMMUNITY)
Admission: RE | Admit: 2020-06-11 | Discharge: 2020-06-11 | Disposition: A | Discharge status: Home / Self Care | Payer: BC Managed Care – PPO | Source: Ambulatory Visit | Attending: Orthopedic Surgery | Admitting: Orthopedic Surgery

## 2020-06-11 ENCOUNTER — Other Ambulatory Visit: Payer: Self-pay

## 2020-06-11 ENCOUNTER — Encounter (HOSPITAL_COMMUNITY)
Admission: RE | Admit: 2020-06-11 | Discharge: 2020-06-11 | Disposition: A | Discharge status: Home / Self Care | Payer: BC Managed Care – PPO | Source: Ambulatory Visit | Attending: Orthopedic Surgery | Admitting: Orthopedic Surgery

## 2020-06-11 DIAGNOSIS — Z01811 Encounter for preprocedural respiratory examination: Secondary | ICD-10-CM | POA: Diagnosis not present

## 2020-06-11 DIAGNOSIS — Z01818 Encounter for other preprocedural examination: Secondary | ICD-10-CM | POA: Diagnosis not present

## 2020-06-11 HISTORY — DX: Anxiety disorder, unspecified: F41.9

## 2020-06-11 LAB — CBC
HCT: 43.5 % (ref 36.0–46.0)
Hemoglobin: 13.9 g/dL (ref 12.0–15.0)
MCH: 31.4 pg (ref 26.0–34.0)
MCHC: 32 g/dL (ref 30.0–36.0)
MCV: 98.4 fL (ref 80.0–100.0)
Platelets: 352 10*3/uL (ref 150–400)
RBC: 4.42 MIL/uL (ref 3.87–5.11)
RDW: 12.2 % (ref 11.5–15.5)
WBC: 6.5 10*3/uL (ref 4.0–10.5)
nRBC: 0 % (ref 0.0–0.2)

## 2020-06-11 LAB — BASIC METABOLIC PANEL
Anion gap: 9 (ref 5–15)
BUN: 10 mg/dL (ref 6–20)
CO2: 27 mmol/L (ref 22–32)
Calcium: 9.6 mg/dL (ref 8.9–10.3)
Chloride: 102 mmol/L (ref 98–111)
Creatinine, Ser: 0.59 mg/dL (ref 0.44–1.00)
GFR, Estimated: >60 mL/min (ref >60)
Glucose, Bld: 75 mg/dL (ref 70–99)
Potassium: 3.5 mmol/L (ref 3.5–5.1)
Sodium: 138 mmol/L (ref 135–145)

## 2020-06-11 LAB — URINALYSIS, ROUTINE W REFLEX MICROSCOPIC
Bacteria, UA: NONE SEEN
Bilirubin Urine: NEGATIVE
Glucose, UA: NEGATIVE mg/dL
Ketones, ur: NEGATIVE mg/dL
Leukocytes,Ua: NEGATIVE
Nitrite: NEGATIVE
Protein, ur: NEGATIVE mg/dL
Specific Gravity, Urine: 1.002 — ABNORMAL LOW (ref 1.005–1.030)
pH: 6 (ref 5.0–8.0)

## 2020-06-11 LAB — SURGICAL PCR SCREEN
MRSA, PCR: NEGATIVE
Staphylococcus aureus: POSITIVE — AB

## 2020-06-11 LAB — PROTIME-INR
INR: 1 (ref 0.8–1.2)
Prothrombin Time: 12.5 seconds (ref 11.4–15.2)

## 2020-06-11 LAB — APTT: aPTT: 26 seconds (ref 24–36)

## 2020-06-11 NOTE — Progress Notes (Signed)
PCP -  Dr.wharton @ [facility] [place] Cardiologist - na   Chest x-ray - 06/11/20 EKG - na Stress Test - na ECHO - na Cardiac Cath - na  Sleep Study - na   Blood Thinner Instructions: na Aspirin Instructions:  na   COVID TEST-  06/17/20   Anesthesia review: consult per Dr. Shon Baton  Patient denies shortness of breath, fever, cough and chest pain at PAT appointment   All instructions explained to the patient, with a verbal understanding of the material. Patient agrees to go over the instructions while at home for a better understanding. Patient also instructed to self quarantine after being tested for COVID-19. The opportunity to ask questions was provided.

## 2020-06-11 NOTE — Progress Notes (Signed)
[id number] [address] [place] [address] Phone: [phone] Fax: [phone]      Your procedure is scheduled on Wednesday, December 29th.  Report to [facility] Main Entrance "A" at 12:00 P.M., and check in at the Admitting office.  Call this number if you have problems the morning of surgery:  [phone]  Call [phone] if you have any questions prior to your surgery date Monday-Friday 8am-4pm    Remember:  Do not eat or drink after midnight the night before your surgery     Take these medicines the morning of surgery with A SIP OF WATER   Tylenol - if needed  Clonazepam (Klonopin)   Fluoxetine (Prozac)  Eye drops - if needed    As of today, STOP taking any Aspirin (unless otherwise instructed by your surgeon) Aleve, Naproxen, Ibuprofen, Motrin, Advil, Goody's, BC's, all herbal medications, fish oil, and all vitamins.                      Do not wear jewelry, make up, or nail polish            Do not wear lotions, powders, perfumes, or deodorant.            Do not shave 48 hours prior to surgery.  .            Do not bring valuables to the hospital.            [facility] is not responsible for any belongings or valuables.  Do NOT Smoke (Tobacco/Vaping) or drink Alcohol 24 hours prior to your procedure If you use a CPAP at night, you may bring all equipment for your overnight stay.   Contacts, glasses, dentures or bridgework may not be worn into surgery.      For patients admitted to the hospital, discharge time will be determined by your treatment team.   Patients discharged the day of surgery will not be allowed to drive home, and someone needs to stay with them for 24 hours.    Special instructions:   [place]- Preparing For Surgery  Before surgery, you can play an important role. Because skin is not sterile, your skin needs to be as free of germs as possible. You can reduce the number of germs on your  skin by washing with CHG (chlorahexidine gluconate) Soap before surgery.  CHG is an antiseptic cleaner which kills germs and bonds with the skin to continue killing germs even after washing.    Oral Hygiene is also important to reduce your risk of infection.  Remember - BRUSH YOUR TEETH THE MORNING OF SURGERY WITH YOUR REGULAR TOOTHPASTE  Please do not use if you have an allergy to CHG or antibacterial soaps. If your skin becomes reddened/irritated stop using the CHG.  Do not shave (including legs and underarms) for at least 48 hours prior to first CHG shower. It is OK to shave your face.  Please follow these instructions carefully.   1. Shower the NIGHT BEFORE SURGERY and the MORNING OF SURGERY with CHG Soap.   2. If you chose to wash your hair, wash your hair first as usual with your normal shampoo.  3. After you shampoo, rinse your hair and body thoroughly to remove the shampoo.  4. Use CHG as you would any other liquid soap. You can apply CHG directly to the skin and wash gently with a scrungie or a clean washcloth.  5. Apply the CHG Soap to your body ONLY FROM THE NECK DOWN.  Do not use on open wounds or open sores. Avoid contact with your eyes, ears, mouth and genitals (private parts). Wash Face and genitals (private parts)  with your normal soap.   6. Wash thoroughly, paying special attention to the area where your surgery will be performed.  7. Thoroughly rinse your body with warm water from the neck down.  8. DO NOT shower/wash with your normal soap after using and rinsing off the CHG Soap.  9. Pat yourself dry with a CLEAN TOWEL.  10. Wear CLEAN PAJAMAS to bed the night before surgery  11. Place CLEAN SHEETS on your bed the night of your first shower and DO NOT SLEEP WITH PETS.   Day of Surgery: Wear Clean/Comfortable clothing the morning of surgery Do not apply any deodorants/lotions.   Remember to brush your teeth WITH YOUR REGULAR TOOTHPASTE.   Please read over the  following fact sheets that you were given.

## 2020-06-12 NOTE — Progress Notes (Signed)
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  History includes former smoker (quit 2006), migraines, anxiety.  She denied shortness of breath, cough, fever, chest pain at PAT RN visit.  Preoperative COVID-19 test is scheduled for 06/17/2020.  Anesthesia team to evaluate on the day of surgery.  She is for urine pregnancy test on arrival.  VS: BP 136/88   Pulse (!) 104   Temp 36.6 C (Oral)   Resp 17   Ht 5\' 4"  (1.626 m)   Wt 59.3 kg   LMP 03/12/2020   SpO2 100%   BMI 22.45 kg/m   PROVIDERS: 03/14/2020, PA-C is PCP   LABS: Labs reviewed: Acceptable for surgery. (all labs ordered are listed, but only abnormal results are displayed)  Labs Reviewed  SURGICAL PCR SCREEN - Abnormal; Notable for the following components:      Result Value   Staphylococcus aureus POSITIVE (*)    All other components within normal limits  URINALYSIS, ROUTINE W REFLEX MICROSCOPIC - Abnormal; Notable for the following components:   Color, Urine COLORLESS (*)    Specific Gravity, Urine 1.002 (*)    Hgb urine dipstick SMALL (*)    All other components within normal limits  BASIC METABOLIC PANEL  PROTIME-INR  APTT  CBC     IMAGES: CXR 06/11/20: FINDINGS: Lungs are clear. Heart size and pulmonary vascularity are normal. No adenopathy. No bone lesions. IMPRESSION: Lungs clear.  Cardiac silhouette normal.   EKG: N/A   CV: N/A  Past Medical History:  Diagnosis Date  . Anxiety   . Migraine     Past Surgical History:  Procedure Laterality Date  . NO PAST SURGERIES    . WISDOM TOOTH EXTRACTION      MEDICATIONS: . acetaminophen (TYLENOL) 500 MG tablet  .  bismuth subsalicylate (PEPTO BISMOL) 262 MG/15ML suspension  . clonazePAM (KLONOPIN) 0.5 MG tablet  . cyclobenzaprine (FLEXERIL) 5 MG tablet  . etonogestrel (NEXPLANON) 68 MG IMPL implant  . FLUoxetine (PROZAC) 20 MG tablet  . Ketotifen Fumarate (ALLERGY EYE DROPS OP)  . Menthol 5 % PTCH  . traMADol (ULTRAM) 50 MG tablet  . Ubrogepant (UBRELVY) 50 MG TABS   No current facility-administered medications for this encounter.    06/13/20, PA-C Surgical Short Stay/Anesthesiology [facility] Phone [phone] [facility] Phone [phone] 06/12/2020 2:09 PM

## 2020-06-17 ENCOUNTER — Ambulatory Visit: Payer: Self-pay | Admitting: Orthopedic Surgery

## 2020-06-17 ENCOUNTER — Other Ambulatory Visit (HOSPITAL_COMMUNITY)
Admission: RE | Admit: 2020-06-17 | Discharge: 2020-06-17 | Disposition: A | Discharge status: Home / Self Care | Payer: BC Managed Care – PPO | Source: Ambulatory Visit | Attending: Orthopedic Surgery | Admitting: Orthopedic Surgery

## 2020-06-17 DIAGNOSIS — Z20822 Contact with and (suspected) exposure to covid-19: Secondary | ICD-10-CM | POA: Diagnosis not present

## 2020-06-17 DIAGNOSIS — Z01812 Encounter for preprocedural laboratory examination: Secondary | ICD-10-CM | POA: Diagnosis not present

## 2020-06-17 NOTE — H&P (Signed)
Subjective:   For associated symptoms, patient reports radiation down leg but reports no weakness, no numbness, no tingling (rare that she feels anything shoot down the leg), and no change in bowel/bladder habits. For location, she reports right, midline, and deep. For quality, she reports aching, sharp, and worsening. For severity, she reports pain level 7/10 and worst pain 10/10. For duration, she reports 3-4 months. For timing, she reports gradual and nighttime. For context, she reports cannot identify. For alleviating factors, she reports lying down, heat, stretching, pt/ot, and narcotics (tramadol, flexeril). For aggravating factors, she reports sitting, standing, and bending/squatting. For previous surgery, she reports none. For prior imaging, she reports x ray and mri (eo). For previous injections, she reports none. For previous pt, she reports helped significantly. For work related, she reports no. For working, she reports no.  Past Medical History:  Diagnosis Date  . Anxiety   . Migraine     Past Surgical History:  Procedure Laterality Date  . NO PAST SURGERIES    . WISDOM TOOTH EXTRACTION      Current Outpatient Medications  Medication Sig Dispense Refill Last Dose  . acetaminophen (TYLENOL) 500 MG tablet Take 1,000 mg by mouth every 6 (six) hours as needed for moderate pain or headache.     . bismuth subsalicylate (PEPTO BISMOL) 262 MG/15ML suspension Take 30 mLs by mouth every 6 (six) hours as needed for indigestion.     . clonazePAM (KLONOPIN) 0.5 MG tablet Take 0.5 mg by mouth 2 (two) times daily as needed for anxiety.     . cyclobenzaprine (FLEXERIL) 5 MG tablet Take 5 mg by mouth 3 (three) times daily as needed for muscle spasms.     Marland Kitchen etonogestrel (NEXPLANON) 68 MG IMPL implant 1 each by Subdermal route once.     Marland Kitchen FLUoxetine (PROZAC) 20 MG tablet Take 20 mg by mouth daily.     Marland Kitchen Ketotifen Fumarate (ALLERGY EYE DROPS OP) Place 1 drop into both eyes daily as needed (itching).      . Menthol 5 % PTCH Apply 1 patch topically daily as needed (pain).     . traMADol (ULTRAM) 50 MG tablet Take 50 mg by mouth 2 (two) times daily as needed for moderate pain.     Marland Kitchen Ubrogepant (UBRELVY) 50 MG TABS Take 50 mg by mouth daily as needed (migraines).      No current facility-administered medications for this visit.   Allergies  Allergen Reactions  . Aspirin Hives  . Penicillins Hives    Social History   Tobacco Use  . Smoking status: Former Smoker    Packs/day: 0.25    Years: 5.00    Pack years: 1.25    Types: Cigarettes    Quit date: 2006    Years since quitting: 15.9  . Smokeless tobacco: Never Used  Substance Use Topics  . Alcohol use: Yes    Comment: couple drinks daily-truely    No family history on file.  Review of Systems As stated in HPI   Objective:   Clinical exam: Linda Moody is a pleasant individual, who appears younger than their stated age. She is alert and orientated 3. No shortness of breath, chest pain.   Abdomen is soft and non-tender, negative loss of bowel and bladder control, no rebound tenderness. Negative: skin lesions abrasions contusions  Peripheral pulses: 2+ dorsalis pedis/posterior tibialis pulses bilaterally. Compartment soft and nontender.  Gait pattern: Normal  Assistive devices: None  Neuro: 5/5 motor strength in the lower  external bilaterally. Positive reproduction of proximal leg pain with straight leg maneuvering on the right side. Symmetrical 1+ deep tendon reflexes at the knee and absent at the Achilles. Intermittent numbness and dysesthesias into the right L5 dermatome. Negative Babinski test, no clonus Musculoskeletal: Significant back pain with palpation in the lumbar spine radiating into the paraspinal region. No SI joint pain. Negative Faber test. No hip, knee, ankle pain with SI joint range of motion  X-rays of the lumbar spine completed on 12/07/19 were reviewed. No scoliosis or spondylolisthesis is noted. No significant  collapse of the disc spaces.  Lumbar MRI: completed on 05/11/20 was reviewed with the patient. [facility] was [facility] at [facility]; I have independently reviewed the images as well as the radiology report. Moderate to large central right sided disc extrusion at L4-5 causing central stenosis and compression on the descending L5 and S1 nerve roots. No disc herniation or stenosis L1/2. Mild degenerative changes L2-4. Small disc bulge at L5-S1 centrally but no impingement on the neural elements.  Assessment:   Linda Moody is a very pleasant 39 year old has had 6 months of severe back buttock and intermittent right radicular leg pain. Despite protracted physical therapy as well as medications her quality-of-life his continue to deteriorate. Imaging studies confirm a disc herniation at L4-5 causing neural compression and stenosis. At this point we have had a long conversation about treatment options such as injection therapy conservative at this point she has expressed a desire to move forward with surgery.  Plan:    I have reviewed the risks and benefits as well as the alternatives and all of her questions were encouraged and addressed. Risks and benefits of decompression/discectomy: Infection, bleeding, death, stroke, paralysis, ongoing or worse pain, need for additional surgery, leak of spinal fluid, adjacent segment degeneration requiring additional surgery, post-operative hematoma formation that can result in neurological compromise and the need for urgent/emergent re-operation. Loss in bowel and bladder control. Injury to major vessels that could result in the need for urgent abdominal surgery to stop bleeding. Risk of deep venous thrombosis (DVT) and the need for additional treatment. Recurrent disc herniation resulting in the need for revision surgery, which could include fusion surgery (utilizing instrumentation such as pedicle screws and intervertebral cages). Additional risk: If instrumentation is used there is  a risk of migration, or breakage of that hardware that could require additional surgery. Treatment plan: Plan on moving forward with an L4-5 lumbar decompression/discectomy in the near future. We will obtain preoperative medical clearance from her primary care physician.

## 2020-06-18 ENCOUNTER — Observation Stay (HOSPITAL_COMMUNITY): Payer: BC Managed Care – PPO

## 2020-06-18 ENCOUNTER — Encounter (HOSPITAL_COMMUNITY): Payer: Self-pay | Admitting: Orthopedic Surgery

## 2020-06-18 ENCOUNTER — Ambulatory Visit: Payer: Self-pay | Admitting: Orthopedic Surgery

## 2020-06-18 ENCOUNTER — Observation Stay (HOSPITAL_COMMUNITY)
Admission: RE | Admit: 2020-06-18 | Discharge: 2020-06-20 | Disposition: A | Discharge status: Home / Self Care | Payer: BC Managed Care – PPO | Source: Ambulatory Visit | Attending: Orthopedic Surgery | Admitting: Orthopedic Surgery

## 2020-06-18 ENCOUNTER — Other Ambulatory Visit: Payer: Self-pay

## 2020-06-18 DIAGNOSIS — Z419 Encounter for procedure for purposes other than remedying health state, unspecified: Secondary | ICD-10-CM

## 2020-06-18 DIAGNOSIS — M5116 Intervertebral disc disorders with radiculopathy, lumbar region: Secondary | ICD-10-CM | POA: Diagnosis not present

## 2020-06-18 DIAGNOSIS — Z87891 Personal history of nicotine dependence: Secondary | ICD-10-CM | POA: Diagnosis not present

## 2020-06-18 DIAGNOSIS — M5126 Other intervertebral disc displacement, lumbar region: Secondary | ICD-10-CM | POA: Diagnosis not present

## 2020-06-18 DIAGNOSIS — M5117 Intervertebral disc disorders with radiculopathy, lumbosacral region: Secondary | ICD-10-CM | POA: Diagnosis not present

## 2020-06-18 DIAGNOSIS — M48061 Spinal stenosis, lumbar region without neurogenic claudication: Secondary | ICD-10-CM | POA: Diagnosis not present

## 2020-06-18 DIAGNOSIS — R600 Localized edema: Secondary | ICD-10-CM | POA: Diagnosis not present

## 2020-06-18 DIAGNOSIS — Z79899 Other long term (current) drug therapy: Secondary | ICD-10-CM | POA: Diagnosis not present

## 2020-06-18 DIAGNOSIS — M5416 Radiculopathy, lumbar region: Secondary | ICD-10-CM | POA: Insufficient documentation

## 2020-06-18 LAB — MRSA PCR SCREENING: MRSA by PCR: NEGATIVE

## 2020-06-18 LAB — SARS CORONAVIRUS 2 (TAT 6-24 HRS): SARS Coronavirus 2: NEGATIVE

## 2020-06-18 MED ORDER — ACETAMINOPHEN 325 MG PO TABS: 325.0000 mg | ORAL_TABLET | Freq: Four times a day (QID) | ORAL | Status: AC | PRN

## 2020-06-18 MED ORDER — OXYCODONE HCL 5 MG PO TABS
5.0000 mg | ORAL_TABLET | ORAL | Status: DC | PRN
  Administered 2020-06-18: 10 mg via ORAL
  Filled 2020-06-18 (×2): qty 2

## 2020-06-18 MED ORDER — ONDANSETRON HCL 4 MG/2ML IJ SOLN
4.0000 mg | Freq: Four times a day (QID) | INTRAMUSCULAR | Status: DC | PRN
  Administered 2020-06-18 – 2020-06-19 (×2): 4 mg via INTRAVENOUS
  Filled 2020-06-18 (×2): qty 2

## 2020-06-18 MED ORDER — METHOCARBAMOL 1000 MG/10ML IJ SOLN
500.0000 mg | Freq: Four times a day (QID) | INTRAVENOUS | Status: DC | PRN
  Filled 2020-06-18 (×3): qty 5

## 2020-06-18 MED ORDER — CLONAZEPAM 0.5 MG PO TABS
0.5000 mg | ORAL_TABLET | Freq: Two times a day (BID) | ORAL | Status: DC | PRN
  Administered 2020-06-19: 0.5 mg via ORAL
  Filled 2020-06-18: qty 1

## 2020-06-18 MED ORDER — FLUOXETINE HCL 20 MG PO TABS
20.0000 mg | ORAL_TABLET | Freq: Every day | ORAL | Status: DC
  Filled 2020-06-18: qty 1

## 2020-06-18 MED ORDER — METHOCARBAMOL 500 MG PO TABS
500.0000 mg | ORAL_TABLET | Freq: Four times a day (QID) | ORAL | Status: AC | PRN
Start: 2020-06-18 — End: ?

## 2020-06-18 MED ORDER — GABAPENTIN 300 MG PO CAPS
300.0000 mg | ORAL_CAPSULE | Freq: Three times a day (TID) | ORAL | Status: DC
  Administered 2020-06-18 (×3): 300 mg via ORAL
  Filled 2020-06-18 (×2): qty 1

## 2020-06-18 MED ORDER — METHOCARBAMOL 1000 MG/10ML IJ SOLN: 500.0000 mg | Freq: Four times a day (QID) | INTRAVENOUS | Status: AC | PRN

## 2020-06-18 MED ORDER — ONDANSETRON HCL 4 MG PO TABS: 4.0000 mg | ORAL_TABLET | Freq: Four times a day (QID) | ORAL | Status: DC | PRN

## 2020-06-18 MED ORDER — GABAPENTIN 100 MG PO CAPS
300.0000 mg | ORAL_CAPSULE | Freq: Three times a day (TID) | ORAL | Status: AC
Start: 2020-06-18 — End: ?

## 2020-06-18 MED ORDER — METHOCARBAMOL 500 MG PO TABS
500.0000 mg | ORAL_TABLET | Freq: Four times a day (QID) | ORAL | Status: DC | PRN
  Administered 2020-06-18 – 2020-06-19 (×3): 500 mg via ORAL
  Filled 2020-06-18 (×3): qty 1

## 2020-06-18 MED ORDER — MORPHINE SULFATE (PF) 2 MG/ML IV SOLN
1.0000 mg | INTRAVENOUS | Status: DC | PRN
  Administered 2020-06-18 – 2020-06-19 (×3): 1 mg via INTRAVENOUS
  Filled 2020-06-18 (×3): qty 1

## 2020-06-18 MED ORDER — OXYCODONE HCL 5 MG PO TABS: 5.0000 mg | ORAL_TABLET | ORAL | Status: AC | PRN

## 2020-06-18 MED ORDER — OXYCODONE HCL 5 MG PO TABS
10.0000 mg | ORAL_TABLET | ORAL | Status: DC | PRN
  Administered 2020-06-18: 10 mg via ORAL
  Administered 2020-06-19 (×2): 15 mg via ORAL
  Administered 2020-06-19: 10 mg via ORAL
  Filled 2020-06-18: qty 2
  Filled 2020-06-18 (×2): qty 3

## 2020-06-18 MED ORDER — ACETAMINOPHEN 325 MG PO TABS: 325.0000 mg | ORAL_TABLET | Freq: Four times a day (QID) | ORAL | Status: DC | PRN

## 2020-06-18 NOTE — H&P (Signed)
Subjective:   Patient is a 39 y.o. female presented with a history of radicular right leg pain with known lumbar disc herniation scheduled for surgery with Dr. Shon Baton tomorrow, unfortunately over the last few days the patients symptoms have progressed and her pain is not well controlled with PO medications at home. Given her progressive symptoms and the fact that her pain is severe despite PO medicines at home we have decided to admit her for pain control and an updated MRI prior to surgery tomorrow. Patient states she has had worsening right leg weakness over the last few days. Denies any loss of control of bladder or bowel function. She has been using Gabapentin 1200 mg daily in addition to tramadol and tylenol. She cannot have NSAIDs as to decrease her operative bleeding risk.     Patient Active Problem List   Diagnosis Date Noted  . Lumbar radiculopathy 06/18/2020  . Herniation of lumbar intervertebral disc with radiculopathy 06/18/2020   Past Medical History:  Diagnosis Date  . Anxiety   . Migraine     Past Surgical History:  Procedure Laterality Date  . NO PAST SURGERIES    . WISDOM TOOTH EXTRACTION      No current facility-administered medications for this encounter.   Facility-Administered Medications Ordered in Other Encounters  Medication Dose Route Frequency Provider Last Rate Last Admin  . [START ON 06/19/2020] acetaminophen (TYLENOL) tablet 325-650 mg  325-650 mg Oral Q6H PRN Rito Lecomte, Leonette Monarch, PA-C      . gabapentin (NEURONTIN) capsule 300 mg  300 mg Oral TID Azusena Erlandson, Leonette Monarch, PA-C      . methocarbamol (ROBAXIN) tablet 500 mg  500 mg Oral Q6H PRN Jamall Strohmeier, Leonette Monarch, PA-C       Or  . methocarbamol (ROBAXIN) 500 mg in dextrose 5 % 50 mL IVPB  500 mg Intravenous Q6H PRN Rito Lecomte, Leonette Monarch, PA-C      . oxyCODONE (Oxy IR/ROXICODONE) immediate release tablet 5-10 mg  5-10 mg Oral Q4H PRN Ajanae Virag, Leonette Monarch, PA-C       Allergies  Allergen Reactions  . Aspirin Hives  . Penicillins Hives     Social History   Tobacco Use  . Smoking status: Former Smoker    Packs/day: 0.25    Years: 5.00    Pack years: 1.25    Types: Cigarettes    Quit date: 2006    Years since quitting: 16.0  . Smokeless tobacco: Never Used  Substance Use Topics  . Alcohol use: Yes    Comment: couple drinks daily-truely    No family history on file.  Review of Systems Pertinent items are noted in HPI.  Objective:   Clinical exam: Linda Moody is a pleasant individual. She is alert and orientated 3. No shortness of breath, chest pain. Somewhat tearful on exam.   Abdomen is soft and non-tender, negative loss of bowel and bladder control, no rebound tenderness. Negative: skin lesions abrasions contusions  Peripheral pulses: 2+ dorsalis pedis/posterior tibialis pulses bilaterally. Compartment soft and nontender.  Gait pattern: Not assessed due to patients significant radicular leg pain.  Assistive devices: wheel chair  Neuro: Patient complaining of severe constant burning radicular type leg pain in the right L5 dermatome, progressed since previous exam. Difficult to assess motor function. Given her significant pain she has global right LE weakness which is most likely pain inhibition versus true motor deficit. +SLR on right.   Musculoskeletal: Significant back pain with palpation in the lumbar spine radiating into the paraspinal region.  No SI joint pain.  No hip, knee, ankle pain with isolated range of motion  X-rays of the lumbar spine completed on 12/07/19 were reviewed. No scoliosis or spondylolisthesis is noted. No significant collapse of the disc spaces.  Lumbar MRI: completed on 05/11/20  Moderate to large central right sided disc extrusion at L4-5 causing central stenosis and compression on the descending L5 and S1 nerve roots. No disc herniation or stenosis L1/2. Mild degenerative changes L2-4. Small disc bulge at L5-S1 centrally but no impingement on the neural elements.  Assessment:   Active  Problems:   Herniation of lumbar intervertebral disc with radiculopathy  Linda Moody is a very pleasant 39 year old has had 6 months of severe back buttock and intermittent right radicular leg pain. Despite protracted physical therapy as well as medications her quality-of-life his continue to deteriorate. Imaging studies confirmed a disc herniation at L4-5 causing neural compression and stenosis. We have previously discussed risks and benefits of surgical intervention and the patient elected to move forward with surgical intervention which is scheduled for tomorrow. Unfortunately, over the last few days the symptoms pain and weakness has progressed requiring an admission for pain management and updated Lumbar spine MRI prior to moving forward with surgery tomorrow.   Plan:   1. Multimodal pain control including gabapentin, tylenol, robaxin prn muscle spasms,  oxycodone for moderate to severe pain and morphine for breakthrough pain PRN. No NSAIDs.  2. Update MRI of lumbar spine TODAY to evaluate for any increase in size of the HNP or new pathology which would alter the surgical plan and/or cause her increase in symptoms.  3. Regular diet for lunch and dinner NPO after midnight for surgery in am 4. DVT ppx: Teds, SCDs, ambulation-- may walk as tolerated.  5. Pre-op labs, CXR, EKG, COVID test done prior. 6. Plan on moving forward with surgical intervention tomorrow am (right L4-5 discectomy) after review of MRI.   Patients husband at bedside, all of patient and patient's husbands questions were invited and addressed.

## 2020-06-18 NOTE — Progress Notes (Signed)
Patient arrived to 4NP room #3 by wheelchair. Patient has husband at the bedside. Patient was given the call light and informed about the room. Patient requested to sit in the wheelchair while I got things together. Call light in reach.

## 2020-06-19 ENCOUNTER — Encounter (HOSPITAL_COMMUNITY)
Admission: RE | Disposition: A | Discharge status: Home / Self Care | Payer: Self-pay | Source: Ambulatory Visit | Attending: Orthopedic Surgery

## 2020-06-19 ENCOUNTER — Observation Stay (HOSPITAL_COMMUNITY): Payer: BC Managed Care – PPO | Admitting: Vascular Surgery

## 2020-06-19 ENCOUNTER — Observation Stay (HOSPITAL_COMMUNITY): Payer: BC Managed Care – PPO

## 2020-06-19 ENCOUNTER — Observation Stay (HOSPITAL_COMMUNITY): Payer: BC Managed Care – PPO | Admitting: Anesthesiology

## 2020-06-19 ENCOUNTER — Encounter (HOSPITAL_COMMUNITY): Payer: Self-pay | Admitting: Orthopedic Surgery

## 2020-06-19 DIAGNOSIS — M5126 Other intervertebral disc displacement, lumbar region: Secondary | ICD-10-CM | POA: Diagnosis present

## 2020-06-19 DIAGNOSIS — Z981 Arthrodesis status: Secondary | ICD-10-CM | POA: Diagnosis not present

## 2020-06-19 DIAGNOSIS — M5116 Intervertebral disc disorders with radiculopathy, lumbar region: Secondary | ICD-10-CM | POA: Diagnosis not present

## 2020-06-19 DIAGNOSIS — Z87891 Personal history of nicotine dependence: Secondary | ICD-10-CM | POA: Diagnosis not present

## 2020-06-19 DIAGNOSIS — Z79899 Other long term (current) drug therapy: Secondary | ICD-10-CM | POA: Diagnosis not present

## 2020-06-19 HISTORY — PX: LUMBAR LAMINECTOMY/DECOMPRESSION MICRODISCECTOMY: SHX5026

## 2020-06-19 LAB — PREGNANCY, URINE: Preg Test, Ur: NEGATIVE

## 2020-06-19 SURGERY — LUMBAR LAMINECTOMY/DECOMPRESSION MICRODISCECTOMY 1 LEVEL
Anesthesia: General | Site: Spine Lumbar | Laterality: Right

## 2020-06-19 MED ORDER — SODIUM CHLORIDE 0.9 % IV SOLN: 250.0000 mL | INTRAVENOUS | Status: DC

## 2020-06-19 MED ORDER — DEXAMETHASONE SODIUM PHOSPHATE 10 MG/ML IJ SOLN
INTRAMUSCULAR | Status: AC
  Filled 2020-06-19: qty 1

## 2020-06-19 MED ORDER — MIDAZOLAM HCL 2 MG/2ML IJ SOLN
INTRAMUSCULAR | Status: AC
  Filled 2020-06-19: qty 2

## 2020-06-19 MED ORDER — MIDAZOLAM HCL 5 MG/5ML IJ SOLN
INTRAMUSCULAR | Status: DC | PRN
  Administered 2020-06-19: 2 mg via INTRAVENOUS

## 2020-06-19 MED ORDER — PHENYLEPHRINE HCL (PRESSORS) 10 MG/ML IV SOLN
INTRAVENOUS | Status: AC
  Filled 2020-06-19: qty 1

## 2020-06-19 MED ORDER — LIDOCAINE 2% (20 MG/ML) 5 ML SYRINGE
INTRAMUSCULAR | Status: DC | PRN
  Administered 2020-06-19: 60 mg via INTRAVENOUS

## 2020-06-19 MED ORDER — PHENYLEPHRINE HCL (PRESSORS) 10 MG/ML IV SOLN
INTRAVENOUS | Status: DC | PRN
  Administered 2020-06-19: 120 ug via INTRAVENOUS
  Administered 2020-06-19: 160 ug via INTRAVENOUS
  Administered 2020-06-19: 120 ug via INTRAVENOUS
  Administered 2020-06-19: 200 ug via INTRAVENOUS
  Administered 2020-06-19: 40 ug via INTRAVENOUS

## 2020-06-19 MED ORDER — 0.9 % SODIUM CHLORIDE (POUR BTL) OPTIME
TOPICAL | Status: DC | PRN
  Administered 2020-06-19: 1000 mL

## 2020-06-19 MED ORDER — ACETAMINOPHEN 650 MG RE SUPP: 650.0000 mg | RECTAL | Status: DC | PRN

## 2020-06-19 MED ORDER — METHOCARBAMOL 1000 MG/10ML IJ SOLN
500.0000 mg | Freq: Four times a day (QID) | INTRAVENOUS | Status: DC | PRN
  Filled 2020-06-19: qty 5

## 2020-06-19 MED ORDER — SUGAMMADEX SODIUM 200 MG/2ML IV SOLN
INTRAVENOUS | Status: DC | PRN
  Administered 2020-06-19: 200 mg via INTRAVENOUS

## 2020-06-19 MED ORDER — FENTANYL CITRATE (PF) 100 MCG/2ML IJ SOLN: 25.0000 ug | INTRAMUSCULAR | Status: DC | PRN

## 2020-06-19 MED ORDER — BUPIVACAINE LIPOSOME 1.3 % IJ SUSP
20.0000 mL | Freq: Once | INTRAMUSCULAR | Status: AC
  Administered 2020-06-19: 20 mL
  Filled 2020-06-19 (×2): qty 20

## 2020-06-19 MED ORDER — MENTHOL 3 MG MT LOZG: 1.0000 | LOZENGE | OROMUCOSAL | Status: DC | PRN

## 2020-06-19 MED ORDER — THROMBIN 20000 UNITS EX SOLR
CUTANEOUS | Status: DC | PRN
  Administered 2020-06-19: 20 mL

## 2020-06-19 MED ORDER — BUPIVACAINE-EPINEPHRINE 0.25% -1:200000 IJ SOLN
INTRAMUSCULAR | Status: DC | PRN
  Administered 2020-06-19: 10 mL
  Administered 2020-06-19: 20 mL

## 2020-06-19 MED ORDER — OXYCODONE HCL 5 MG PO TABS
10.0000 mg | ORAL_TABLET | ORAL | Status: DC | PRN
  Administered 2020-06-19 – 2020-06-20 (×4): 10 mg via ORAL
  Filled 2020-06-19 (×5): qty 2

## 2020-06-19 MED ORDER — ONDANSETRON HCL 4 MG/2ML IJ SOLN
INTRAMUSCULAR | Status: DC | PRN
  Administered 2020-06-19: 4 mg via INTRAVENOUS

## 2020-06-19 MED ORDER — PROPOFOL 10 MG/ML IV BOLUS
INTRAVENOUS | Status: AC
  Filled 2020-06-19: qty 20

## 2020-06-19 MED ORDER — VANCOMYCIN HCL IN DEXTROSE 1-5 GM/200ML-% IV SOLN
1000.0000 mg | INTRAVENOUS | Status: AC
  Administered 2020-06-19: 1000 mg via INTRAVENOUS
  Filled 2020-06-19: qty 200

## 2020-06-19 MED ORDER — HEMOSTATIC AGENTS (NO CHARGE) OPTIME
TOPICAL | Status: DC | PRN
  Administered 2020-06-19: 1

## 2020-06-19 MED ORDER — PHENYLEPHRINE HCL-NACL 10-0.9 MG/250ML-% IV SOLN
INTRAVENOUS | Status: DC | PRN
  Administered 2020-06-19: 25 ug/min via INTRAVENOUS

## 2020-06-19 MED ORDER — ACETAMINOPHEN 500 MG PO TABS: 1000.0000 mg | ORAL_TABLET | Freq: Once | ORAL | Status: AC

## 2020-06-19 MED ORDER — METHYLPREDNISOLONE ACETATE 40 MG/ML INJ SUSP (RADIOLOG
INTRAMUSCULAR | Status: DC | PRN
  Administered 2020-06-19: 40 mg

## 2020-06-19 MED ORDER — ONDANSETRON HCL 4 MG PO TABS: 4.0000 mg | ORAL_TABLET | Freq: Four times a day (QID) | ORAL | Status: DC | PRN

## 2020-06-19 MED ORDER — FENTANYL CITRATE (PF) 250 MCG/5ML IJ SOLN
INTRAMUSCULAR | Status: AC
  Filled 2020-06-19: qty 5

## 2020-06-19 MED ORDER — ROCURONIUM BROMIDE 10 MG/ML (PF) SYRINGE
PREFILLED_SYRINGE | INTRAVENOUS | Status: DC | PRN
  Administered 2020-06-19: 40 mg via INTRAVENOUS

## 2020-06-19 MED ORDER — MAGNESIUM CITRATE PO SOLN: 1.0000 | Freq: Once | ORAL | Status: DC | PRN

## 2020-06-19 MED ORDER — SODIUM CHLORIDE 0.9% FLUSH: 3.0000 mL | Freq: Two times a day (BID) | INTRAVENOUS | Status: DC

## 2020-06-19 MED ORDER — LACTATED RINGERS IV SOLN: INTRAVENOUS | Status: DC

## 2020-06-19 MED ORDER — ONDANSETRON HCL 4 MG/2ML IJ SOLN: 4.0000 mg | Freq: Four times a day (QID) | INTRAMUSCULAR | Status: DC | PRN

## 2020-06-19 MED ORDER — FENTANYL CITRATE (PF) 100 MCG/2ML IJ SOLN
INTRAMUSCULAR | Status: DC | PRN
  Administered 2020-06-19 (×5): 50 ug via INTRAVENOUS

## 2020-06-19 MED ORDER — PROPOFOL 10 MG/ML IV BOLUS
INTRAVENOUS | Status: DC | PRN
  Administered 2020-06-19: 170 mg via INTRAVENOUS
  Administered 2020-06-19: 30 mg via INTRAVENOUS

## 2020-06-19 MED ORDER — FLUOXETINE HCL 20 MG PO CAPS: 20.0000 mg | ORAL_CAPSULE | Freq: Every day | ORAL | Status: DC

## 2020-06-19 MED ORDER — ACETAMINOPHEN 325 MG PO TABS
650.0000 mg | ORAL_TABLET | ORAL | Status: DC | PRN
  Administered 2020-06-19 – 2020-06-20 (×3): 650 mg via ORAL
  Filled 2020-06-19 (×3): qty 2

## 2020-06-19 MED ORDER — AMISULPRIDE (ANTIEMETIC) 5 MG/2ML IV SOLN: 10.0000 mg | Freq: Once | INTRAVENOUS | Status: DC | PRN

## 2020-06-19 MED ORDER — ONDANSETRON HCL 4 MG PO TABS: 4.0000 mg | ORAL_TABLET | Freq: Three times a day (TID) | ORAL | 0 refills | Status: AC | PRN

## 2020-06-19 MED ORDER — METHYLPREDNISOLONE ACETATE 40 MG/ML IJ SUSP
INTRAMUSCULAR | Status: AC
  Filled 2020-06-19: qty 1

## 2020-06-19 MED ORDER — GABAPENTIN 300 MG PO CAPS
300.0000 mg | ORAL_CAPSULE | Freq: Three times a day (TID) | ORAL | Status: DC
  Administered 2020-06-19 – 2020-06-20 (×2): 300 mg via ORAL
  Filled 2020-06-19 (×2): qty 1

## 2020-06-19 MED ORDER — CHLORHEXIDINE GLUCONATE 0.12 % MT SOLN
OROMUCOSAL | Status: AC
  Administered 2020-06-19: 15 mL via OROMUCOSAL
  Filled 2020-06-19: qty 15

## 2020-06-19 MED ORDER — OXYCODONE HCL 5 MG PO TABS: 5.0000 mg | ORAL_TABLET | ORAL | Status: DC | PRN

## 2020-06-19 MED ORDER — DEXAMETHASONE SODIUM PHOSPHATE 10 MG/ML IJ SOLN
INTRAMUSCULAR | Status: DC | PRN
  Administered 2020-06-19: 8 mg via INTRAVENOUS

## 2020-06-19 MED ORDER — ACETAMINOPHEN 500 MG PO TABS
ORAL_TABLET | ORAL | Status: AC
  Administered 2020-06-19: 1000 mg via ORAL
  Filled 2020-06-19: qty 2

## 2020-06-19 MED ORDER — CHLORHEXIDINE GLUCONATE 0.12 % MT SOLN
15.0000 mL | OROMUCOSAL | Status: AC
  Filled 2020-06-19: qty 15

## 2020-06-19 MED ORDER — POLYETHYLENE GLYCOL 3350 17 G PO PACK
17.0000 g | PACK | Freq: Every day | ORAL | Status: DC | PRN
Start: 2020-06-19 — End: 2020-06-20

## 2020-06-19 MED ORDER — PHENOL 1.4 % MT LIQD: 1.0000 | OROMUCOSAL | Status: DC | PRN

## 2020-06-19 MED ORDER — ONDANSETRON HCL 4 MG/2ML IJ SOLN
INTRAMUSCULAR | Status: AC
  Filled 2020-06-19: qty 2

## 2020-06-19 MED ORDER — METHOCARBAMOL 500 MG PO TABS
500.0000 mg | ORAL_TABLET | Freq: Four times a day (QID) | ORAL | Status: DC | PRN
  Administered 2020-06-19 – 2020-06-20 (×3): 500 mg via ORAL
  Filled 2020-06-19 (×4): qty 1

## 2020-06-19 MED ORDER — VANCOMYCIN HCL IN DEXTROSE 1-5 GM/200ML-% IV SOLN
1000.0000 mg | Freq: Once | INTRAVENOUS | Status: AC
  Administered 2020-06-20: 1000 mg via INTRAVENOUS
  Filled 2020-06-19: qty 200

## 2020-06-19 MED ORDER — OXYCODONE-ACETAMINOPHEN 10-325 MG PO TABS: 1.0000 | ORAL_TABLET | Freq: Four times a day (QID) | ORAL | 0 refills | Status: AC | PRN

## 2020-06-19 MED ORDER — SODIUM CHLORIDE 0.9% FLUSH: 3.0000 mL | INTRAVENOUS | Status: DC | PRN

## 2020-06-19 MED ORDER — METHOCARBAMOL 500 MG PO TABS: 500.0000 mg | ORAL_TABLET | Freq: Three times a day (TID) | ORAL | 0 refills | Status: AC | PRN

## 2020-06-19 MED ORDER — MORPHINE SULFATE (PF) 2 MG/ML IV SOLN: 2.0000 mg | INTRAVENOUS | Status: AC | PRN

## 2020-06-19 SURGICAL SUPPLY — 57 items
BNDG GAUZE ELAST 4 BULKY (GAUZE/BANDAGES/DRESSINGS) ×2 IMPLANT
BUR EGG ELITE 4.0 (BURR) IMPLANT
BUR MATCHSTICK NEURO 3.0 LAGG (BURR) IMPLANT
CANISTER SUCT 3000ML PPV (MISCELLANEOUS) ×2 IMPLANT
CLSR STERI-STRIP ANTIMIC 1/2X4 (GAUZE/BANDAGES/DRESSINGS) ×2 IMPLANT
CORD BIPOLAR FORCEPS 12FT (ELECTRODE) ×2 IMPLANT
COVER SURGICAL LIGHT HANDLE (MISCELLANEOUS) ×2 IMPLANT
COVER WAND RF STERILE (DRAPES) ×2 IMPLANT
DRAIN CHANNEL 15F RND FF W/TCR (WOUND CARE) IMPLANT
DRAPE POUCH INSTRU U-SHP 10X18 (DRAPES) ×2 IMPLANT
DRAPE SURG 17X23 STRL (DRAPES) ×2 IMPLANT
DRAPE U-SHAPE 47X51 STRL (DRAPES) ×2 IMPLANT
DRSG OPSITE POSTOP 3X4 (GAUZE/BANDAGES/DRESSINGS) ×2 IMPLANT
DRSG OPSITE POSTOP 4X6 (GAUZE/BANDAGES/DRESSINGS) ×2 IMPLANT
DURAPREP 26ML APPLICATOR (WOUND CARE) ×2 IMPLANT
ELECT BLADE 4.0 EZ CLEAN MEGAD (MISCELLANEOUS)
ELECT CAUTERY BLADE 6.4 (BLADE) ×2 IMPLANT
ELECT PENCIL ROCKER SW 15FT (MISCELLANEOUS) ×2 IMPLANT
ELECT REM PT RETURN 9FT ADLT (ELECTROSURGICAL) ×2
ELECTRODE BLDE 4.0 EZ CLN MEGD (MISCELLANEOUS) IMPLANT
ELECTRODE REM PT RTRN 9FT ADLT (ELECTROSURGICAL) ×1 IMPLANT
EVACUATOR SILICONE 100CC (DRAIN) IMPLANT
GLOVE BIO SURGEON STRL SZ 6.5 (GLOVE) ×2 IMPLANT
GLOVE BIOGEL PI IND STRL 6.5 (GLOVE) ×1 IMPLANT
GLOVE BIOGEL PI IND STRL 8.5 (GLOVE) ×1 IMPLANT
GLOVE BIOGEL PI INDICATOR 6.5 (GLOVE) ×1
GLOVE BIOGEL PI INDICATOR 8.5 (GLOVE) ×1
GLOVE SS BIOGEL STRL SZ 8.5 (GLOVE) ×1 IMPLANT
GLOVE SUPERSENSE BIOGEL SZ 8.5 (GLOVE) ×1
GOWN STRL REUS W/ TWL LRG LVL3 (GOWN DISPOSABLE) ×2 IMPLANT
GOWN STRL REUS W/TWL 2XL LVL3 (GOWN DISPOSABLE) ×2 IMPLANT
GOWN STRL REUS W/TWL LRG LVL3 (GOWN DISPOSABLE) ×2
KIT BASIN OR (CUSTOM PROCEDURE TRAY) ×2 IMPLANT
KIT TURNOVER KIT B (KITS) ×2 IMPLANT
NEEDLE 22X1 1/2 (OR ONLY) (NEEDLE) ×2 IMPLANT
NEEDLE SPNL 18GX3.5 QUINCKE PK (NEEDLE) ×4 IMPLANT
NS IRRIG 1000ML POUR BTL (IV SOLUTION) ×2 IMPLANT
PACK LAMINECTOMY ORTHO (CUSTOM PROCEDURE TRAY) ×2 IMPLANT
PACK UNIVERSAL I (CUSTOM PROCEDURE TRAY) ×2 IMPLANT
PAD ARMBOARD 7.5X6 YLW CONV (MISCELLANEOUS) ×4 IMPLANT
PATTIES SURGICAL .5 X.5 (GAUZE/BANDAGES/DRESSINGS) ×2 IMPLANT
PATTIES SURGICAL .5 X1 (DISPOSABLE) ×2 IMPLANT
SPONGE SURGIFOAM ABS GEL 100 (HEMOSTASIS) ×2 IMPLANT
SURGIFLO W/THROMBIN 8M KIT (HEMOSTASIS) ×2 IMPLANT
SUT BONE WAX W31G (SUTURE) ×2 IMPLANT
SUT MNCRL AB 3-0 PS2 27 (SUTURE) ×2 IMPLANT
SUT VIC AB 0 CT1 27 (SUTURE)
SUT VIC AB 0 CT1 27XBRD ANBCTR (SUTURE) IMPLANT
SUT VIC AB 1 CT1 18XCR BRD 8 (SUTURE) ×1 IMPLANT
SUT VIC AB 1 CT1 8-18 (SUTURE) ×1
SUT VIC AB 2-0 CT1 18 (SUTURE) ×2 IMPLANT
SYR BULB IRRIG 60ML STRL (SYRINGE) ×2 IMPLANT
SYR CONTROL 10ML LL (SYRINGE) ×2 IMPLANT
TOWEL GREEN STERILE (TOWEL DISPOSABLE) ×2 IMPLANT
TOWEL GREEN STERILE FF (TOWEL DISPOSABLE) ×2 IMPLANT
WATER STERILE IRR 1000ML POUR (IV SOLUTION) ×2 IMPLANT
YANKAUER SUCT BULB TIP NO VENT (SUCTIONS) IMPLANT

## 2020-06-19 NOTE — Progress Notes (Signed)
Pharmacy Antibiotic Note  Linda Moody is a 39 y.o. female admitted on 06/18/2020 with  Lumbar disc hernation with radiulopathy right L4-5,  Presented for surgery 06/19/20.  Now s/p lumbar disectomy.  Pharmacy has been consulted for Vancomycin dosing for surgical prophylaxis.  No drain placed, thus plan for one dose vancomycin post op.  Received Vancomycin 1000 mg IV preop at 12:25 today 06/19/20.  Preop labs: SCr 0.59. estimated CrCl is 81.5 ml/min.    Plan: Vancomycin 1000 mg IV x1 tonight at midnight, 12 hours after preop dose.  Pharmacy will sign off.  Please re-consult pharmacy if assistance needed.   Height: 5\' 4"  (162.6 cm) Weight: 59.3 kg (130 lb 12.8 oz) IBW/kg (Calculated) : 54.7  Temp (24hrs), Avg:98.5 F (36.9 C), Min:98.2 F (36.8 C), Max:99 F (37.2 C)  No results for input(s): WBC, CREATININE, LATICACIDVEN, VANCOTROUGH, VANCOPEAK, VANCORANDOM, GENTTROUGH, GENTPEAK, GENTRANDOM, TOBRATROUGH, TOBRAPEAK, TOBRARND, AMIKACINPEAK, AMIKACINTROU, AMIKACIN in the last 168 hours.  Estimated Creatinine Clearance: 81.5 mL/min (by C-G formula based on SCr of 0.59 mg/dL).    Allergies  Allergen Reactions  . Aspirin Hives  . Penicillins Hives   Thank you for allowing pharmacy to be a part of this patient's care.  , RPh Clinical Pharmacist  Please check AMION for all [facility] phone numbers After 10:00 PM, call Main Pharmacy [phone] 06/19/2020 6:12 PM

## 2020-06-19 NOTE — Progress Notes (Signed)
Pain continues throughout the shift . Pain levels 7-9 Rt. Leg. Describes as burning shooting pain. Pain medication relieves for a short period of time and then she describes the pain as awakening her out of her sleep. Call light in reach. Remains NPO since Midnight except sips w/ pain medications. Continuing to monitor.

## 2020-06-19 NOTE — Progress Notes (Signed)
Urine specimen sent to lab for STAT urine pregnancy test. This RN also asked pt if she had been treated for the staph found in her nares 12/21 per short stay RN, Payton Emerald. Pt was not aware that she had tested positive for staph in the nares and has not been treated.   Robina Ade, RN

## 2020-06-19 NOTE — Progress Notes (Signed)
    Subjective: Procedure(s) (LRB): Right L4-5 disectomy (Right) Day of Surgery  Patient reports pain as 8 on 0-10 scale.  Reports increased right leg pain Positive void Negative bowel movement Positive flatus Negative chest pain or shortness of breath  Objective: Vital signs in last 24 hours: Temp:  [97.8 F (36.6 C)-99 F (37.2 C)] 99 F (37.2 C) (12/29 0741) Pulse Rate:  [91-109] 109 (12/29 0741) Resp:  [16-20] 16 (12/29 0741) BP: (101-146)/(63-96) 101/63 (12/29 0741) SpO2:  [97 %-100 %] 97 % (12/29 0741) Weight:  [59.3 kg] 59.3 kg (12/29 1201)  Intake/Output from previous day: 12/28 0701 - 12/29 0700 In: 480 [P.O.:480] Out: -   Labs: No results for input(s): WBC, RBC, HCT, PLT in the last 72 hours. No results for input(s): NA, K, CL, CO2, BUN, CREATININE, GLUCOSE, CALCIUM in the last 72 hours. No results for input(s): LABPT, INR in the last 72 hours.  Physical Exam: ABD soft Intact pulses distally Compartment soft positive right st leg raise,  numbness and decreased sensation to light touch in the right L5 dermatome Body mass index is 22.45 kg/m.   Assessment/Plan: Patient stable  MRI of the lumbar spine completed yesterday demonstrates a slightly larger L5-S1 disc herniation posterior lateral to the right.  This is most likely the cause of her increased radicular right leg pain that necessitated her admission yesterday for pain management preoperatively.  She currently is in the preop holding area alert and oriented x3 complaining of severe right radicular leg pain.  Her clinical exam is essentially unchanged with right L5 radicular leg pain and dysesthesias.  I have gone over the surgical procedure with the patient and her husband including the risks benefits and alternatives to surgery.  They have both expressed an understanding and a willingness to move forward with surgery.  Venita Lick, MD [facility] [phone]

## 2020-06-19 NOTE — Discharge Instructions (Signed)

## 2020-06-19 NOTE — Transfer of Care (Signed)
Immediate Anesthesia Transfer of Care Note  Patient: Stormie R Dirr  Procedure(s) Performed: Right LUMBAR FOUR-LUMBAR FIVE disectomy (Right Spine Lumbar)  Patient Location: PACU  Anesthesia Type:General  Level of Consciousness: drowsy and patient cooperative  Airway & Oxygen Therapy: Patient Spontanous Breathing  Post-op Assessment: Report given to RN and Post -op Vital signs reviewed and stable  Post vital signs: Reviewed and stable  Last Vitals:  Vitals Value Taken Time  BP 126/67 06/19/20 1617  Temp    Pulse 107 06/19/20 1618  Resp 10 06/19/20 1618  SpO2 99 % 06/19/20 1618  Vitals shown include unvalidated device data.  Last Pain:  Vitals:   06/19/20 1119  TempSrc:   PainSc: 8       Patients Stated Pain Goal: 0 (06/19/20 0500)  Complications: No complications documented.

## 2020-06-19 NOTE — Anesthesia Preprocedure Evaluation (Signed)
Anesthesia Evaluation  Patient identified by MRN, date of birth, ID band Patient awake    Reviewed: Allergy & Precautions, NPO status , Patient's Chart, lab work & pertinent test results  Airway Mallampati: II  TM Distance: >3 FB Neck ROM: Full    Dental  (+) Dental Advisory Given   Pulmonary former smoker,    breath sounds clear to auscultation       Cardiovascular negative cardio ROS   Rhythm:Regular Rate:Normal     Neuro/Psych  Headaches,  Neuromuscular disease    GI/Hepatic negative GI ROS, Neg liver ROS,   Endo/Other  negative endocrine ROS  Renal/GU negative Renal ROS     Musculoskeletal   Abdominal   Peds  Hematology negative hematology ROS (+)   Anesthesia Other Findings   Reproductive/Obstetrics                             Anesthesia Physical Anesthesia Plan  ASA: II  Anesthesia Plan: General   Post-op Pain Management:    Induction: Intravenous  PONV Risk Score and Plan: 3 and Dexamethasone, Ondansetron and Treatment may vary due to age or medical condition  Airway Management Planned: Oral ETT  Additional Equipment:   Intra-op Plan:   Post-operative Plan: Extubation in OR  Informed Consent: I have reviewed the patients History and Physical, chart, labs and discussed the procedure including the risks, benefits and alternatives for the proposed anesthesia with the patient or authorized representative who has indicated his/her understanding and acceptance.     Dental advisory given  Plan Discussed with: CRNA  Anesthesia Plan Comments:         Anesthesia Quick Evaluation

## 2020-06-19 NOTE — Progress Notes (Signed)
Spoke with Rayfield Citizen, RN on 4NP regarding urine pregnancy test needed prior to surgery today. Verbalized understanding.

## 2020-06-19 NOTE — OR Nursing (Signed)
PER PROTOCOL DR.CLARK CALLED AT 14:51 ON 06/19/2020 AND CONFIRMED L4-L5 WAS LOCALIZED.

## 2020-06-19 NOTE — Anesthesia Procedure Notes (Signed)
Procedure Name: Intubation Date/Time: 06/19/2020 2:09 PM Performed by: Inda Coke, CRNA Pre-anesthesia Checklist: Patient identified, Emergency Drugs available, Suction available and Patient being monitored Patient Re-evaluated:Patient Re-evaluated prior to induction Oxygen Delivery Method: Circle System Utilized Preoxygenation: Pre-oxygenation with 100% oxygen Induction Type: IV induction Ventilation: Mask ventilation without difficulty Laryngoscope Size: Mac and 3 Grade View: Grade I Tube type: Oral Tube size: 7.0 mm Number of attempts: 1 Airway Equipment and Method: Stylet and Oral airway Placement Confirmation: ETT inserted through vocal cords under direct vision,  positive ETCO2 and breath sounds checked- equal and bilateral Secured at: 21 cm Tube secured with: Tape Dental Injury: Teeth and Oropharynx as per pre-operative assessment

## 2020-06-19 NOTE — Progress Notes (Signed)
Report called to short stay RN, Payton Emerald. Second CHG bath given to pt and informed consent filled out and sent. Pt's right leg is in excruciating pain, but is not due for any pain meds. Pt agreeable to go to short stay unit and is pleased to be going to surgery earlier than expected. Pt's glasses and cell phone left in room in drawer next to bed. Pt also left bra and underwear in room.   Robina Ade, RN

## 2020-06-19 NOTE — Brief Op Note (Signed)
06/18/2020 - 06/19/2020  3:57 PM  PATIENT:  Linda Moody  39 y.o. female  PRE-OPERATIVE DIAGNOSIS:  Lumbar disc hernation with radiulopathy right L4-5  POST-OPERATIVE DIAGNOSIS:  Lumbar disc hernation with radiulopathy right L4-5  PROCEDURE:  Procedure(s) with comments: Right LUMBAR FOUR-LUMBAR FIVE disectomy (Right) - 2.5 hrs  SURGEON:  Surgeon(s) and Role:    Venita Lick, MD - Primary  PHYSICIAN ASSISTANT: Amanda Ward, PA   ANESTHESIA:   general  EBL:  20 mL   BLOOD ADMINISTERED:none  DRAINS: none   LOCAL MEDICATIONS USED:  MARCAINE    and OTHER exparel.  Depomedrol  SPECIMEN:  No Specimen  DISPOSITION OF SPECIMEN:  N/A  COUNTS:  YES  TOURNIQUET:  * No tourniquets in log *  DICTATION: .Dragon Dictation  PLAN OF CARE: Admit for overnight observation  PATIENT DISPOSITION:  PACU - hemodynamically stable.

## 2020-06-19 NOTE — Op Note (Signed)
Operative report  Preoperative diagnosis L4-5 right-sided disc herniation with L5 radiculopathy  Postoperative diagnosis: Same  Operative procedure: Right L4 hemilaminotomy for discectomy  CPT code: 53664  First Assistant: Glynis Smiles, PA  Complications: None  Indications: Linda Moody is a very pleasant 39 year old man who presented to my office with complaints of significant back buttock and right radicular leg pain.  Patient had numbness and dysesthesias in the L5 dermatome, and an MRI which demonstrated an L4-5 disc herniation posterior lateral to the right.  After failed attempts at conservative management we elected to move forward with surgery.  Over the last few days her pain has gotten significantly worse.  So much so that she was admitted for improved pain control and a repeat MRI.  The repeat MRI did demonstrate that the disc herniation at L4-5 had increased in size which would account for her increased radicular leg pain.  After discussing risks benefits and alternatives to surgery the patient expressed a willingness to move forward with surgery as well as an understanding of those risks.  Operative report  Patient was brought the operating room placed upon the operating room table.  After successful induction of general anesthesia and endotracheal intubation, teds and SCDs were applied.  She was turned prone onto the Wilson frame and all bony prominences were well-padded.  The back was prepped and draped in a standard fashion.  Standard timeout was taken to confirm patient procedure and all other important pertinent data.  2 needles were placed into the back and an x-ray was taken for localization of the skin incision.  The skin incision was marked out and infiltrated with quarter percent Marcaine with epinephrine.  Midline incision was made and sharp dissection was carried out down to the deep fascia.  I then injected the paraspinal muscles bilaterally with a combination of core percent  Marcaine with epinephrine and Exparel.  This would provide intraoperative hemostasis as well as postoperative analgesia.  I incised the deep fascia and then stripped the paraspinal muscles on the right side to expose the L4 and L5 lamina and the L4-5 facet complex.  Penfield 4 was placed underneath the L4 lamina and a second x-ray was taken which confirmed that we are at the appropriate level.  Using a 3 mm Kerrison rongeur hemilaminotomy of L 4 was carried out.  The bleeding bone edges were sealed with bone wax.  I gently dissected through the ligamentum flavum and then created a plane between the thecal sac and the ligamentum flavum.  Using a 2 mm Kerrison rongeur I removed all of the ligamentum flavum to expose the posterior lateral aspect of the thecal sac.  The L5 nerve root was under significant compression.  I then gently dissected into the lateral recess and performed a medial facetectomy with a 2 mm Kerrison rongeur.  This provided adequate visualization of the posterior annulus.  I incised the annulus and using a fine nerve hook mobilized the disc fragment and removed it with micropituitary rongeurs.  With some of the disc fragment removed I was able to now gently manipulate the L5 nerve root and retracted.  I increased the size of the annulotomy and then used a larger nerve hook to sweep circumferentially to deliver 3 very large fragments of disc material.  These fragments were consistent with the preoperative MRI.  At this point the L5 nerve root was now completely mobile I can easily pass my nerve hook underneath the nerve root out into the foramen and I could  freely mobilize it medially.  Use my 3 mm Kerrison rongeur to complete the medial facetectomy and a proximal foraminotomy to adequately decompress the L5 nerve root.  I then went superiorly to just above the disc space to confirm I had removed all the disc fragment and had an adequate decompression.  At this point I could freely pass my [facility]  elevator superiorly in the lateral recess medially underneath the annulus, and posteriorly along the L5 nerve root into the foramen.  There was no significant compression of the nerve root and I could not feel any further fragments of disc material.  At this point the wound was copiously irrigated with normal saline and I obtained hemostasis using bipolar cautery and FloSeal.  Once I had hemostasis I did 1 final check to ensure had adequate decompression and that the L5 nerve root was freely mobile and then irrigated 1 last time.  I placed 20 mg (1/2 cc) of Depo-Medrol over the nerve to improve postoperative analgesia and then a thrombin-soaked Gelfoam patty over the laminotomy site.  Retractors were removed and the deep fascia was closed with interrupted #1 Vicryl sutures.  Superficial was closed with 2-0 Vicryl suture, and a 3-0 Monocryl for the skin.  Steri-Strips dry dressings were applied she was extubated transfer the PACU without incident.

## 2020-06-20 ENCOUNTER — Encounter (HOSPITAL_COMMUNITY): Payer: Self-pay | Admitting: Orthopedic Surgery

## 2020-06-20 DIAGNOSIS — Z87891 Personal history of nicotine dependence: Secondary | ICD-10-CM | POA: Diagnosis not present

## 2020-06-20 DIAGNOSIS — M5126 Other intervertebral disc displacement, lumbar region: Secondary | ICD-10-CM | POA: Diagnosis not present

## 2020-06-20 DIAGNOSIS — M5116 Intervertebral disc disorders with radiculopathy, lumbar region: Secondary | ICD-10-CM | POA: Diagnosis not present

## 2020-06-20 DIAGNOSIS — Z79899 Other long term (current) drug therapy: Secondary | ICD-10-CM | POA: Diagnosis not present

## 2020-06-20 NOTE — Evaluation (Signed)
Physical Therapy Evaluation Patient Details Name: Linda Moody MRN: [id number] DOB: 27-Nov-1980 Today's Date: 06/20/2020   History of Present Illness  39 year old has had 6 months of severe back buttock and intermittent right radicular leg pain. Imaging studies confirm a disc herniation at L4-5 causing neural compression and stenosis. Underwent Right LUMBAR FOUR-LUMBAR FIVE disectomy  Clinical Impression  Pt presents to PT s/p spinal surgery. Pt is able to complete bed mobility, transfer, ambulate, and negotiate stairs without assistance at this time. Pt is able to verbalize spinal precautions and dons/doffs brace independently. Pt reports resolution of RLE pain, with only incisional pain present currently. Pt has no further concerns about mobility status at this time and has no further acute PT needs. Pt has no PT or DME needs at the time of discharge. Acute PT signing off.    Follow Up Recommendations No PT follow up    Equipment Recommendations  None recommended by PT    Recommendations for Other Services       Precautions / Restrictions Precautions Precautions: Back Precaution Booklet Issued: Yes (comment) Required Braces or Orthoses: Spinal Brace Spinal Brace: Lumbar corset;Applied in sitting position Restrictions Weight Bearing Restrictions: No      Mobility  Bed Mobility Overal bed mobility: Modified Independent             General bed mobility comments: PT provides cues for log roll technique    Transfers Overall transfer level: Independent Equipment used: None                Ambulation/Gait Ambulation/Gait assistance: Independent Gait Distance (Feet): 150 Feet Assistive device: None Gait Pattern/deviations: Step-through pattern Gait velocity: functional Gait velocity interpretation: 1.31 - 2.62 ft/sec, indicative of limited community ambulator General Gait Details: pt with steady step-through gait, no significant gait deviations  noted  Stairs Stairs: Yes Stairs assistance: Modified independent (Device/Increase time) Stair Management: One rail Right;Alternating pattern;Forwards Number of Stairs: 5    Wheelchair Mobility    Modified Rankin (Stroke Patients Only)       Balance Overall balance assessment: Independent                                           Pertinent Vitals/Pain Pain Assessment: Faces Pain Score: 2  Faces Pain Scale: Hurts a little bit Pain Location: incision site Pain Descriptors / Indicators: Grimacing Pain Intervention(s): Monitored during session    Home Living Family/patient expects to be discharged to:: Private residence Living Arrangements: Spouse/significant other;Children Available Help at Discharge: Family;Available 24 hours/day Type of Home: House Home Access: Stairs to enter Entrance Stairs-Rails: Doctor, general practice of Steps: 5 Home Layout: Two level Home Equipment: Cane - single point;Transport chair      Prior Function Level of Independence: Independent with assistive device(s)         Comments: pt recently mobilizing with use of cane due to pain, typically independent without device     Hand Dominance   Dominant Hand: Right    Extremity/Trunk Assessment   Upper Extremity Assessment Upper Extremity Assessment: Overall WFL for tasks assessed    Lower Extremity Assessment Lower Extremity Assessment: Overall WFL for tasks assessed    Cervical / Trunk Assessment Cervical / Trunk Assessment: Normal  Communication   Communication: No difficulties  Cognition Arousal/Alertness: Awake/alert Behavior During Therapy: WFL for tasks assessed/performed Overall Cognitive Status: Within Functional Limits for tasks assessed  General Comments General comments (skin integrity, edema, etc.): VSS on RA    Exercises     Assessment/Plan    PT Assessment Patent does not  need any further PT services  PT Problem List         PT Treatment Interventions      PT Goals (Current goals can be found in the Care Plan section)  Acute Rehab PT Goals Patient Stated Goal: Hurt less and be more independent    Frequency     Barriers to discharge        Co-evaluation               AM-PAC PT "6 Clicks" Mobility  Outcome Measure Help needed turning from your back to your side while in a flat bed without using bedrails?: None Help needed moving from lying on your back to sitting on the side of a flat bed without using bedrails?: None Help needed moving to and from a bed to a chair (including a wheelchair)?: None Help needed standing up from a chair using your arms (e.g., wheelchair or bedside chair)?: None Help needed to walk in hospital room?: None Help needed climbing 3-5 steps with a railing? : None 6 Click Score: 24    End of Session Equipment Utilized During Treatment: Back brace Activity Tolerance: Patient tolerated treatment well Patient left: in bed;with call bell/phone within reach Nurse Communication: Mobility status      Time: 4742-5956 PT Time Calculation (min) (ACUTE ONLY): 12 min   Charges:   PT Evaluation $PT Eval Low Complexity: 1 Low          Arlyss Gandy, PT, DPT Acute Rehabilitation Pager: [phone]   Arlyss Gandy 06/20/2020, 10:28 AM

## 2020-06-20 NOTE — Evaluation (Signed)
Occupational Therapy Evaluation Patient Details Name: BEDELIA PONG MRN: [id number] DOB: February 06, 1981 Today's Date: 06/20/2020    History of Present Illness 39 year old has had 6 months of severe back buttock and intermittent right radicular leg pain. Imaging studies confirm a disc herniation at L4-5 causing neural compression and stenosis. Underwent Right LUMBAR FOUR-LUMBAR FIVE disectomy   Clinical Impression   Patient admitted for diagnosis and procedure above.  Patient did quite well using hip kit for dressing this date.  Able to demonstrated all education regarding mobility, ADL and brace application.  Patient had good questions, and should do well at home.  No further OT needs in the acute setting.  PT eval pending.      Follow Up Recommendations  No OT follow up    Equipment Recommendations  Tub/shower seat;Other (comment)    Recommendations for Other Services       Precautions / Restrictions Precautions Precautions: Back Precaution Booklet Issued: Yes (comment) Required Braces or Orthoses: Spinal Brace Spinal Brace: Lumbar corset;Applied in sitting position Restrictions Weight Bearing Restrictions: No      Mobility Bed Mobility Overal bed mobility: Modified Independent                  Transfers Overall transfer level: Modified independent                    Balance Overall balance assessment: Mild deficits observed, not formally tested                                         ADL either performed or assessed with clinical judgement   ADL Overall ADL's : Modified independent                                       General ADL Comments: Patient able to use hip kit well, all questions answered.     Vision Baseline Vision/History: Wears glasses Patient Visual Report: No change from baseline       Perception     Praxis      Pertinent Vitals/Pain Pain Assessment: 0-10 Pain Score: 2  Pain Location:  incision site Pain Descriptors / Indicators: Aching Pain Intervention(s): Monitored during session     Hand Dominance Right   Extremity/Trunk Assessment Upper Extremity Assessment Upper Extremity Assessment: Overall WFL for tasks assessed   Lower Extremity Assessment Lower Extremity Assessment: Defer to PT evaluation   Cervical / Trunk Assessment Cervical / Trunk Assessment: Normal   Communication Communication Communication: No difficulties   Cognition Arousal/Alertness: Awake/alert Behavior During Therapy: WFL for tasks assessed/performed Overall Cognitive Status: Within Functional Limits for tasks assessed                                     General Comments   VSS    Exercises     Shoulder Instructions      Home Living Family/patient expects to be discharged to:: Private residence Living Arrangements: Spouse/significant other;Children Available Help at Discharge: Family;Available 24 hours/day Type of Home: House Home Access: Stairs to enter [employer] of Steps: 6 Entrance Stairs-Rails: Right;Left Home Layout: Two level Alternate Level Stairs-Number of Steps: 12 Alternate Level Stairs-Rails: Left Bathroom Shower/Tub: Teacher, early years/pre:  Standard     Home Equipment: Cane - single point;Transport chair          Prior Functioning/Environment Level of Independence: Independent with assistive device(s)        Comments: Mobility with cane.  Able to complete ADLs at modified position.  Difficulty with meals and home management depending on back pain.        OT Problem List: Pain      OT Treatment/Interventions:      OT Goals(Current goals can be found in the care plan section) Acute Rehab OT Goals Patient Stated Goal: Hurt less and be more independent OT Goal Formulation: With patient Time For Goal Achievement: 06/20/20 Potential to Achieve Goals: Good  OT Frequency:     Barriers to D/C:  none noted           Co-evaluation              AM-PAC OT "6 Clicks" Daily Activity     Outcome Measure Help from another person eating meals?: None Help from another person taking care of personal grooming?: None Help from another person toileting, which includes using toliet, bedpan, or urinal?: None Help from another person bathing (including washing, rinsing, drying)?: None Help from another person to put on and taking off regular upper body clothing?: None Help from another person to put on and taking off regular lower body clothing?: None 6 Click Score: 24   End of Session Equipment Utilized During Treatment: Back brace Nurse Communication: Mobility status  Activity Tolerance: Patient tolerated treatment well Patient left: in bed;with call bell/phone within reach  OT Visit Diagnosis: Unsteadiness on feet (R26.81)                Time: 2992-4268 OT Time Calculation (min): 48 min Charges:  OT General Charges $OT Visit: 1 Visit OT Evaluation $OT Eval Moderate Complexity: 1 Mod OT Treatments $Self Care/Home Management : 23-37 mins  06/20/2020  Rich, OTR/L  Acute Rehabilitation Services  Office:  [phone]   Metta Clines 06/20/2020, 9:09 AM

## 2020-06-20 NOTE — Progress Notes (Signed)
Patient is discharged from room 3C08 at this time. Alert and in stable condition. IV site d/c'd and instructions read to patient and spouse with understanding verbalized and all questions answered. Left unit via wheelchair with all belongings at side. 

## 2020-06-20 NOTE — Progress Notes (Signed)
    Subjective: Procedure(s) (LRB): Right LUMBAR FOUR-LUMBAR FIVE disectomy (Right) 1 Day Post-Op  Patient reports pain as 0 on 0-10 scale.  Reports resolved leg pain reports incisional back pain   Positive void Negative bowel movement Positive flatus Negative chest pain or shortness of breath  Objective: Vital signs in last 24 hours: Temp:  [98.3 F (36.8 C)-99 F (37.2 C)] 98.8 F (37.1 C) (12/30 0351) Pulse Rate:  [88-118] 88 (12/30 0351) Resp:  [10-18] 18 (12/30 0351) BP: (101-126)/(63-83) 112/68 (12/30 0351) SpO2:  [97 %-100 %] 99 % (12/30 0351) Weight:  [59.3 kg] 59.3 kg (12/29 1201)  Intake/Output from previous day: 12/29 0701 - 12/30 0700 In: 1780 [P.O.:480; I.V.:1300] Out: 20 [Blood:20]  Labs: No results for input(s): WBC, RBC, HCT, PLT in the last 72 hours. No results for input(s): NA, K, CL, CO2, BUN, CREATININE, GLUCOSE, CALCIUM in the last 72 hours. No results for input(s): LABPT, INR in the last 72 hours.  Physical Exam: Neurologically intact ABD soft Intact pulses distally Incision: dressing C/D/I Compartment soft Body mass index is 22.45 kg/m.   Assessment/Plan: Patient stable  xrays n/a Continue mobilization with physical therapy Continue care  Advance diet Up with therapy  Patient doing well - leg pain resolved Plan on discharge to home.  She will follow-up with me in 2 weeks.  Venita Lick, MD [facility] [phone]

## 2020-06-20 NOTE — Discharge Summary (Addendum)
Patient ID: Linda Moody MRN: [id number] DOB/AGE: 39-Nov-1982 39 y.o.  Admit date: 06/18/2020 Discharge date: 06/20/2020  Admission Diagnoses:  Active Problems:   Herniation of lumbar intervertebral disc with radiculopathy   Lumbar disc herniation with radiculopathy   Lumbar disc herniation   Discharge Diagnoses:  Active Problems:   Herniation of lumbar intervertebral disc with radiculopathy   Lumbar disc herniation with radiculopathy   Lumbar disc herniation  status post Procedure(s): Right LUMBAR FOUR-LUMBAR FIVE disectomy  Past Medical History:  Diagnosis Date  . Anxiety   . Migraine     Surgeries: Procedure(s): Right LUMBAR FOUR-LUMBAR FIVE disectomy on 06/19/2020   Consultants:   Discharged Condition: Improved  Hospital Course: Linda Moody is an 39 y.o. female who was admitted 06/18/2020 for operative treatment of lumbar radiculopathy. Patient failed conservative treatments (please see the history and physical for the specifics) and had severe unremitting pain that affects sleep, daily activities and work/hobbies. After pre-op clearance, the patient was taken to the operating room on 06/19/2020 and underwent  Procedure(s): Right LUMBAR FOUR-LUMBAR FIVE disectomy.    Patient was given perioperative antibiotics:  Anti-infectives (From admission, onward)   Start     Dose/Rate Route Frequency Ordered Stop   06/20/20 0000  vancomycin (VANCOCIN) IVPB 1000 mg/200 mL premix        1,000 mg 200 mL/hr over 60 Minutes Intravenous  Once 06/19/20 1826 06/20/20 0818   06/19/20 0737  vancomycin (VANCOCIN) IVPB 1000 mg/200 mL premix        1,000 mg 200 mL/hr over 60 Minutes Intravenous 60 min pre-op 06/19/20 0737 06/19/20 1815       Patient was given sequential compression devices and early ambulation to prevent DVT.   Patient benefited maximally from hospital stay and there were no complications. At the time of discharge, the patient was urinating/moving their bowels  without difficulty, tolerating a regular diet, pain is controlled with oral pain medications and they have been cleared by PT/OT.   Recent vital signs:  Patient Vitals for the past 24 hrs:  BP Temp Temp src Pulse Resp SpO2  06/20/20 0749 125/75 98.2 F (36.8 C) Oral (!) 105 16 99 %  06/20/20 0351 112/68 98.8 F (37.1 C) Oral 88 18 99 %  06/19/20 2312 112/64 98.4 F (36.9 C) Oral 96 18 98 %  06/19/20 1915 118/79 98.5 F (36.9 C) Oral 98 18 100 %     Recent laboratory studies: No results for input(s): WBC, HGB, HCT, PLT, NA, K, CL, CO2, BUN, CREATININE, GLUCOSE, INR, CALCIUM in the last 72 hours.  Invalid input(s): PT, 2   Discharge Medications:   Allergies as of 06/20/2020      Reactions   Aspirin Hives   Penicillins Hives      Medication List    STOP taking these medications   Menthol 5 % Ptch   Nexplanon 68 MG Impl implant Generic drug: etonogestrel   Ubrelvy 50 MG Tabs Generic drug: Ubrogepant     TAKE these medications   ALLERGY EYE DROPS OP Place 1 drop into both eyes daily as needed (itching).   bismuth subsalicylate 262 MG/15ML suspension Commonly known as: PEPTO BISMOL Take 30 mLs by mouth every 6 (six) hours as needed for indigestion.   clonazePAM 0.5 MG tablet Commonly known as: KLONOPIN Take 0.5 mg by mouth 2 (two) times daily as needed for anxiety.   FLUoxetine 20 MG tablet Commonly known as: PROZAC Take 20 mg by mouth daily.  methocarbamol 500 MG tablet Commonly known as: Robaxin Take 1 tablet (500 mg total) by mouth every 8 (eight) hours as needed for up to 5 days for muscle spasms.   ondansetron 4 MG tablet Commonly known as: Zofran Take 1 tablet (4 mg total) by mouth every 8 (eight) hours as needed for nausea or vomiting.   oxyCODONE-acetaminophen 10-325 MG tablet Commonly known as: Percocet Take 1 tablet by mouth every 6 (six) hours as needed for up to 5 days for pain.       Diagnostic Studies: DG Chest 2 View  Result Date:  06/11/2020 CLINICAL DATA:  Preoperative lumbar discectomy EXAM: CHEST - 2 VIEW COMPARISON:  None. FINDINGS: Lungs are clear. Heart size and pulmonary vascularity are normal. No adenopathy. No bone lesions. IMPRESSION: Lungs clear.  Cardiac silhouette normal. Electronically Signed   By: Bretta Bang III M.D.   On: 06/11/2020 16:43   DG Lumbar Spine 2-3 Views  Result Date: 06/19/2020 CLINICAL DATA:  Instrument localization for lumbar surgery. EXAM: LUMBAR SPINE - 2-3 VIEW COMPARISON:  02/08/2018 lumbar radiographs. FINDINGS: Two lateral images lumbar spine were obtained Image number 1 demonstrates a localizer needle overlying the spinal canal at the L3-4 level. A second needle is located posterior to the L4 pedicle. Image number 2 demonstrates a surgical instrument under the L4 lamina at the L4-5 disc space. IMPRESSION: L4-5 disc space localized in the operating room. These results were called by telephone at the time of interpretation on 06/19/2020 at 2:52 pm to provider Annaka in the OR, who verbally acknowledged these results. Electronically Signed   By: Marlan Palau M.D.   On: 06/19/2020 14:53   MR LUMBAR SPINE WO CONTRAST  Result Date: 06/18/2020 CLINICAL DATA:  Lumbar radiculopathy. EXAM: MRI LUMBAR SPINE WITHOUT CONTRAST TECHNIQUE: Multiplanar, multisequence MR imaging of the lumbar spine was performed. No intravenous contrast was administered. COMPARISON:  Lumbar radiographs February 08, 2017. FINDINGS: Segmentation: Standard. The inferior-most fully formed intervertebral disc is labeled L5-S1. Alignment:  Normal. Vertebrae: Vertebral body heights are maintained. Edema involving an inferior L4 endplate degenerative Schmorl's node. No evidence of acute fracture, discitis/osteomyelitis, or suspicious bone lesion. Conus medullaris and cauda equina: Conus extends to the L1 level. Conus appears normal. Paraspinal and other soft tissues: Unremarkable. Disc levels: T12-L1: No significant disc  protrusion, foraminal stenosis, or canal stenosis. L1-L2: No significant disc protrusion, foraminal stenosis, or canal stenosis. L2-L3: No significant disc protrusion, foraminal stenosis, or canal stenosis. L3-L4: Mild disc desiccation. Small broad-based disc bulge without significant canal or foraminal stenosis. L4-L5: Disc desiccation and height loss. Broad-based disc bulge with large right paracentral disc herniation with resulting severe right subarticular recess stenosis and severe canal stenosis. No significant foraminal stenosis. L5-S1: Disc desiccation and height loss. Broad-based disc bulge with central disc protrusion. Mild bilateral subarticular recess stenosis without significant canal stenosis. No significant foraminal stenosis. IMPRESSION: 1. At L4-L5 there is a large right paracentral disc herniation which results in severe right subarticular recess stenosis and severe canal stenosis. 2. At L5-S1 there is a central disc protrusion with mild bilateral subarticular recess stenosis. 3. Edema involving an inferior L4 endplate degenerative Schmorl's node. These results will be called to the ordering clinician or representative by the Radiologist Assistant, and communication documented in the PACS or [employer]. Electronically Signed   By: Feliberto Harts MD   On: 06/18/2020 17:15    Discharge Instructions    Incentive spirometry RT   Complete by: As directed  Follow-up Information    Venita Lick, MD. Schedule an appointment as soon as possible for a visit in 2 weeks.   Specialty: Orthopedic Surgery Why: If symptoms worsen, For suture removal, For wound re-check Contact information: [address] [place] [address] [phone]               Discharge Plan:  discharge to home  Disposition: stable    Signed: Leonette Monarch Jazmina Muhlenkamp for [facility] PA-C [facility] [phone] 06/20/2020, 5:56 PM

## 2020-06-21 NOTE — Anesthesia Postprocedure Evaluation (Signed)
Anesthesia Post Note  Patient: Krissy R Shellhammer  Procedure(s) Performed: Right LUMBAR FOUR-LUMBAR FIVE disectomy (Right Spine Lumbar)     Patient location during evaluation: PACU Anesthesia Type: General Level of consciousness: awake and alert Pain management: pain level controlled Vital Signs Assessment: post-procedure vital signs reviewed and stable Respiratory status: spontaneous breathing, nonlabored ventilation, respiratory function stable and patient connected to nasal cannula oxygen Cardiovascular status: blood pressure returned to baseline and stable Postop Assessment: no apparent nausea or vomiting Anesthetic complications: no   No complications documented.  Last Vitals:  Vitals:   06/20/20 0351 06/20/20 0749  BP: 112/68 125/75  Pulse: 88 (!) 105  Resp: 18 16  Temp: 37.1 C 36.8 C  SpO2: 99% 99%    Last Pain:  Vitals:   06/20/20 1015  TempSrc:   PainSc: 4    Pain Goal: Patients Stated Pain Goal: 3 (06/20/20 0419)                 Kennieth Rad

## 2020-07-25 DIAGNOSIS — Z20822 Contact with and (suspected) exposure to covid-19: Secondary | ICD-10-CM | POA: Diagnosis not present

## 2020-08-16 DIAGNOSIS — M5459 Other low back pain: Secondary | ICD-10-CM | POA: Diagnosis not present

## 2020-08-21 DIAGNOSIS — M5459 Other low back pain: Secondary | ICD-10-CM | POA: Diagnosis not present

## 2020-08-29 DIAGNOSIS — M5459 Other low back pain: Secondary | ICD-10-CM | POA: Diagnosis not present

## 2020-09-10 DIAGNOSIS — M5459 Other low back pain: Secondary | ICD-10-CM | POA: Diagnosis not present

## 2021-02-20 DIAGNOSIS — G43909 Migraine, unspecified, not intractable, without status migrainosus: Secondary | ICD-10-CM | POA: Diagnosis not present

## 2021-02-20 DIAGNOSIS — F411 Generalized anxiety disorder: Secondary | ICD-10-CM | POA: Diagnosis not present

## 2021-02-20 DIAGNOSIS — M545 Low back pain, unspecified: Secondary | ICD-10-CM | POA: Diagnosis not present

## 2021-04-04 DIAGNOSIS — R059 Cough, unspecified: Secondary | ICD-10-CM | POA: Diagnosis not present

## 2021-05-22 DIAGNOSIS — M25511 Pain in right shoulder: Secondary | ICD-10-CM | POA: Diagnosis not present

## 2021-05-28 DIAGNOSIS — M25511 Pain in right shoulder: Secondary | ICD-10-CM | POA: Diagnosis not present

## 2021-05-30 DIAGNOSIS — M25511 Pain in right shoulder: Secondary | ICD-10-CM | POA: Diagnosis not present

## 2021-06-11 DIAGNOSIS — M25511 Pain in right shoulder: Secondary | ICD-10-CM | POA: Diagnosis not present

## 2021-07-21 DIAGNOSIS — M25511 Pain in right shoulder: Secondary | ICD-10-CM | POA: Diagnosis not present

## 2021-08-22 DIAGNOSIS — M25511 Pain in right shoulder: Secondary | ICD-10-CM | POA: Diagnosis not present

## 2022-05-01 DIAGNOSIS — F411 Generalized anxiety disorder: Secondary | ICD-10-CM | POA: Diagnosis not present

## 2022-05-01 DIAGNOSIS — G43909 Migraine, unspecified, not intractable, without status migrainosus: Secondary | ICD-10-CM | POA: Diagnosis not present

## 2022-08-13 DIAGNOSIS — M79674 Pain in right toe(s): Secondary | ICD-10-CM | POA: Diagnosis not present

## 2022-08-13 DIAGNOSIS — M5126 Other intervertebral disc displacement, lumbar region: Secondary | ICD-10-CM | POA: Diagnosis not present

## 2022-10-18 IMAGING — MR MR LUMBAR SPINE W/O CM
4 of 5 series · 18 of 48 positions shown · non-contrast
Comparison: Lumbar radiographs February 08, 2017.

CLINICAL DATA: Lumbar radiculopathy.

EXAM:
MRI LUMBAR SPINE WITHOUT CONTRAST
TECHNIQUE: Multiplanar, multisequence MR imaging of the lumbar spine was
performed. No intravenous contrast was administered.

[Series 2: T2 · sagittal · 4.0mm · 0.51mm/px · 6 of 15 slices shown (1 of 2)]
[im 1/15]
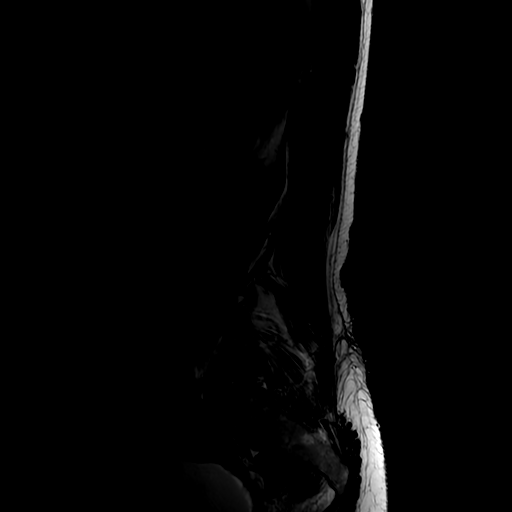
[im 3/15]
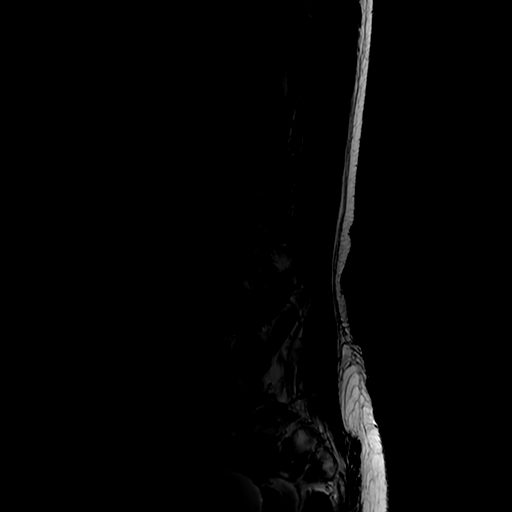
[im 6/15]
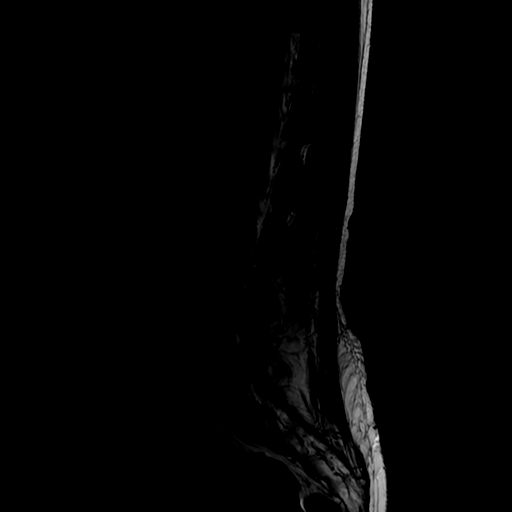
[im 9/15]
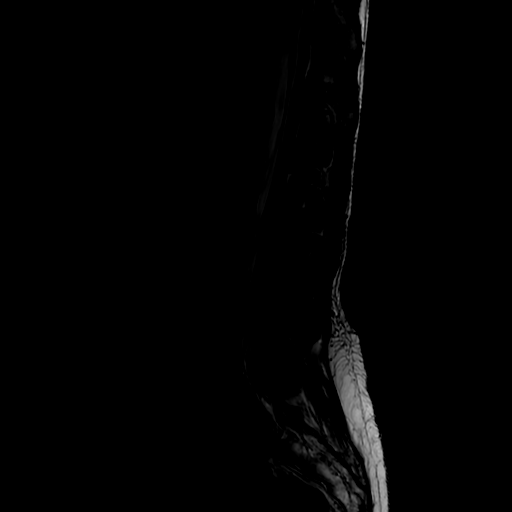
[im 12/15]
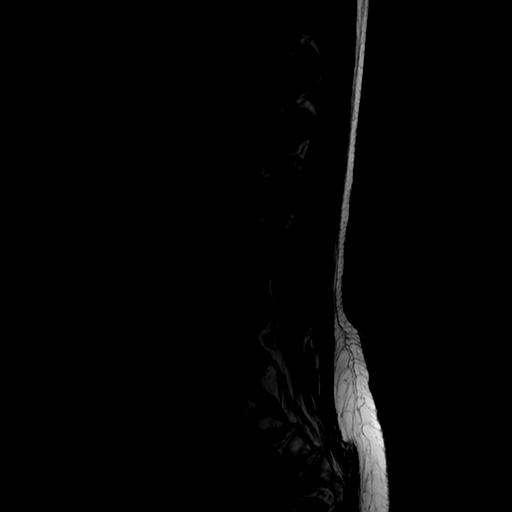
[im 15/15]
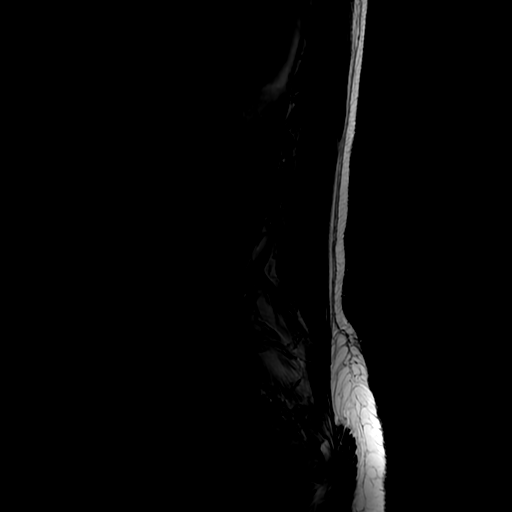

[Series 4: T1 · sagittal · 4.0mm · 0.51mm/px · 3 of 15 slices shown (1 of 2)]
[im 3/15]
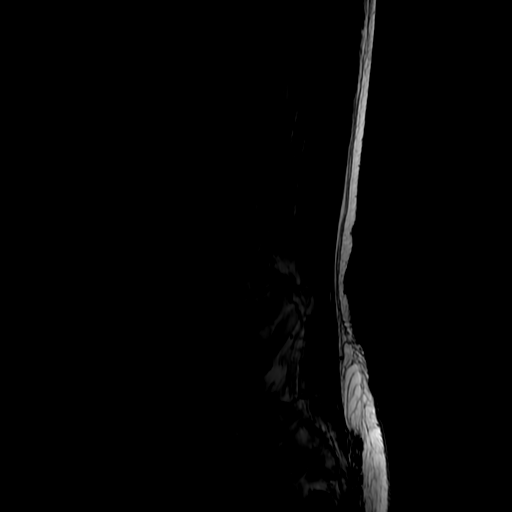
[im 9/15]
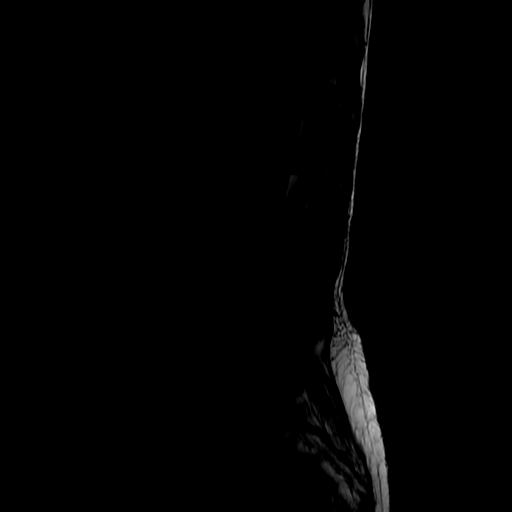
[im 15/15]
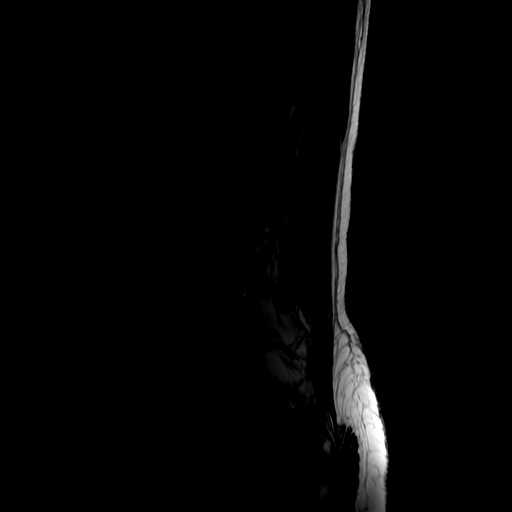

[Series 5: T2 · axial · 4.0mm · 0.39mm/px · z∈[-26,+134]mm · 6 of 39 slices shown (2 of 2)]
[im 1/39]
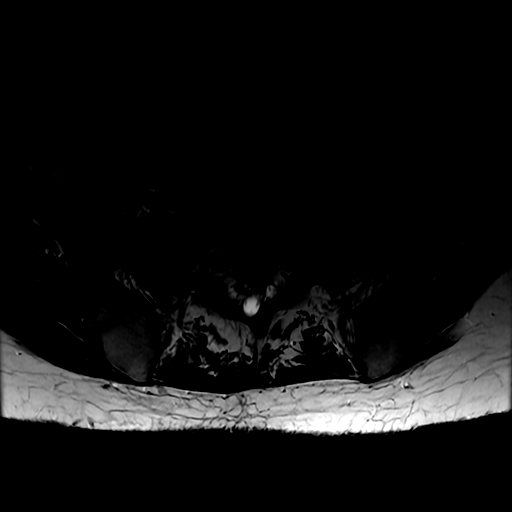
[im 6/39]
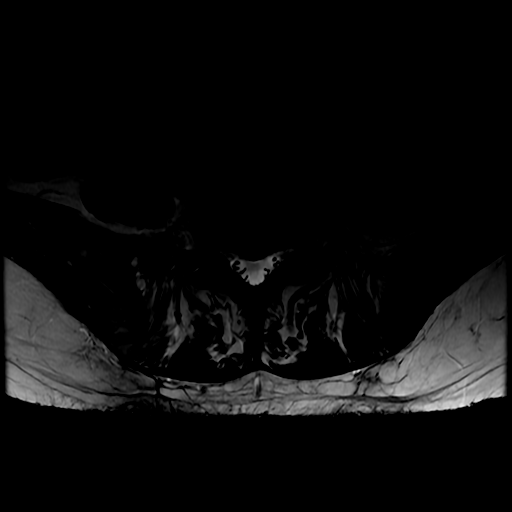
[im 11/39]
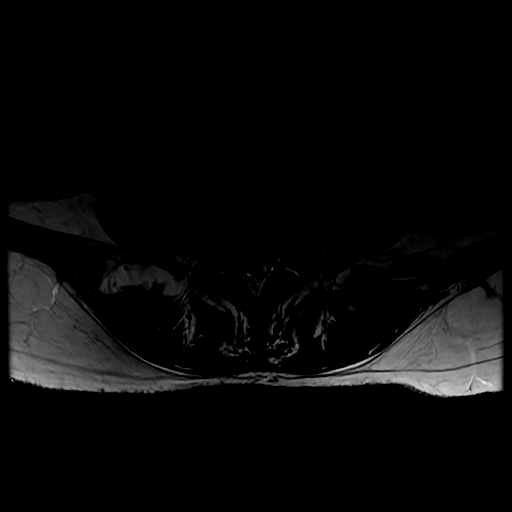
[im 17/39]
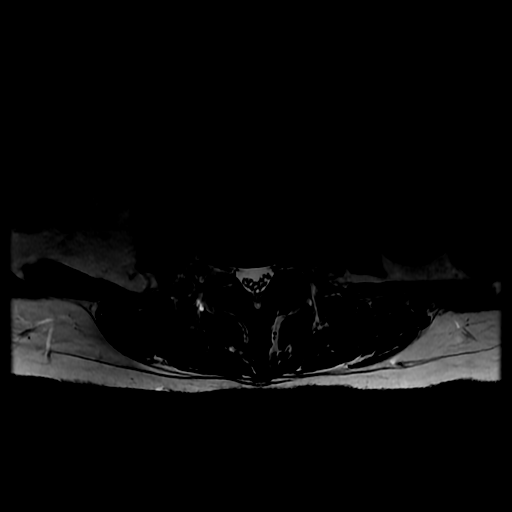
[im 20/39]
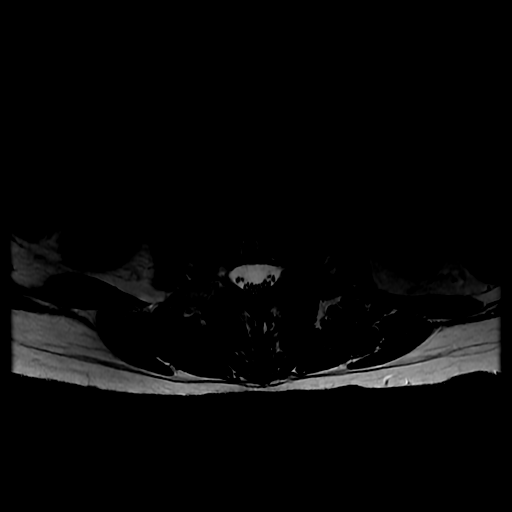
[im 33/39]
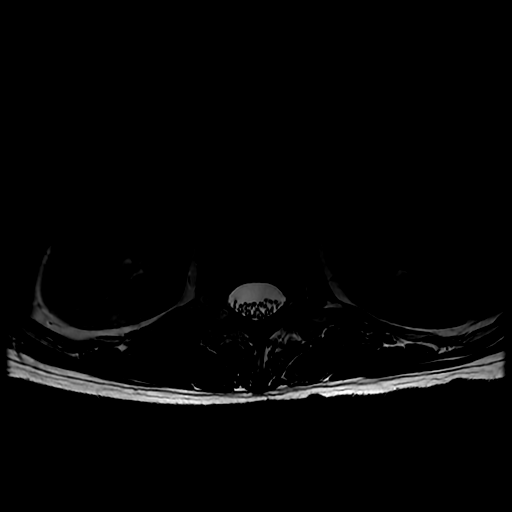

[Series 6: T1 · axial · 4.0mm · 0.39mm/px · z∈[-1,+134]mm · 3 of 39 slices shown (2 of 2)]
[im 6/39]
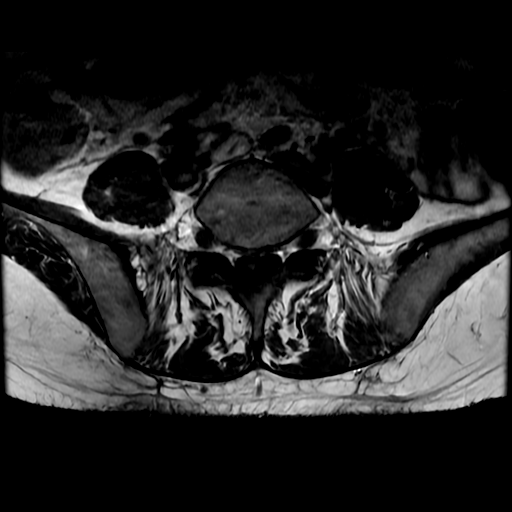
[im 20/39]
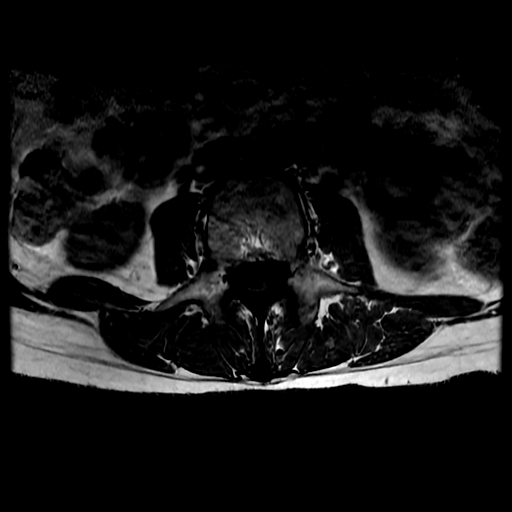
[im 33/39]
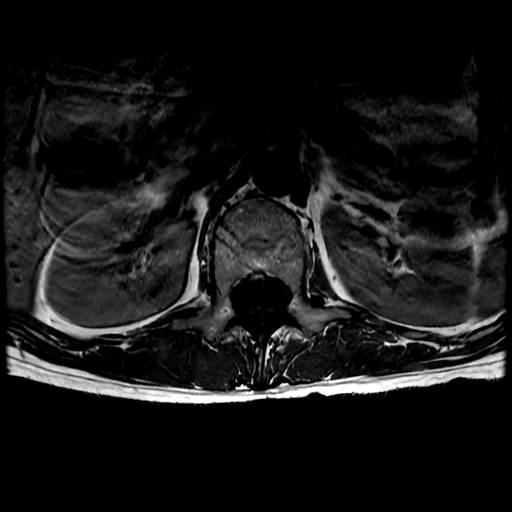

[18 of 48 positions shown; findings below may reference images not displayed]

FINDINGS: Segmentation: Standard. The inferior-most fully formed
intervertebral disc is labeled L5-S1.

Alignment:  Normal.

Vertebrae: Vertebral body heights are maintained. Edema involving an
inferior L4 endplate degenerative Schmorl's node. No evidence of
acute fracture, discitis/osteomyelitis, or suspicious bone lesion.

Conus medullaris and cauda equina: Conus extends to the L1 level.
Conus appears normal.

Paraspinal and other soft tissues: Unremarkable.

Disc levels:

T12-L1: No significant disc protrusion, foraminal stenosis, or canal
stenosis.

L1-L2: No significant disc protrusion, foraminal stenosis, or canal
stenosis.

L2-L3: No significant disc protrusion, foraminal stenosis, or canal
stenosis.

L3-L4: Mild disc desiccation. Small broad-based disc bulge without
significant canal or foraminal stenosis.

L4-L5: Disc desiccation and height loss. Broad-based disc bulge with
large right paracentral disc herniation with resulting severe right
subarticular recess stenosis and severe canal stenosis. No
significant foraminal stenosis.

L5-S1: Disc desiccation and height loss. Broad-based disc bulge with
central disc protrusion. Mild bilateral subarticular recess stenosis
without significant canal stenosis. No significant foraminal
stenosis.
IMPRESSION: 1. At L4-L5 there is a large right paracentral disc herniation which
results in severe right subarticular recess stenosis and severe
canal stenosis.
2. At L5-S1 there is a central disc protrusion with mild bilateral
subarticular recess stenosis.
3. Edema involving an inferior L4 endplate degenerative Schmorl's
node.

These results will be called to the ordering clinician or
representative by the Radiologist Assistant, and communication
documented in the PACS or [REDACTED].

## 2022-10-19 IMAGING — CR DG LUMBAR SPINE 2-3V
2 series · 2 of 2 positions shown · non-contrast
Comparison: 02/08/2018 lumbar radiographs.

CLINICAL DATA: Instrument localization for lumbar surgery.

EXAM:
LUMBAR SPINE - 2-3 VIEW

[xtable lateral (1 of 2)]
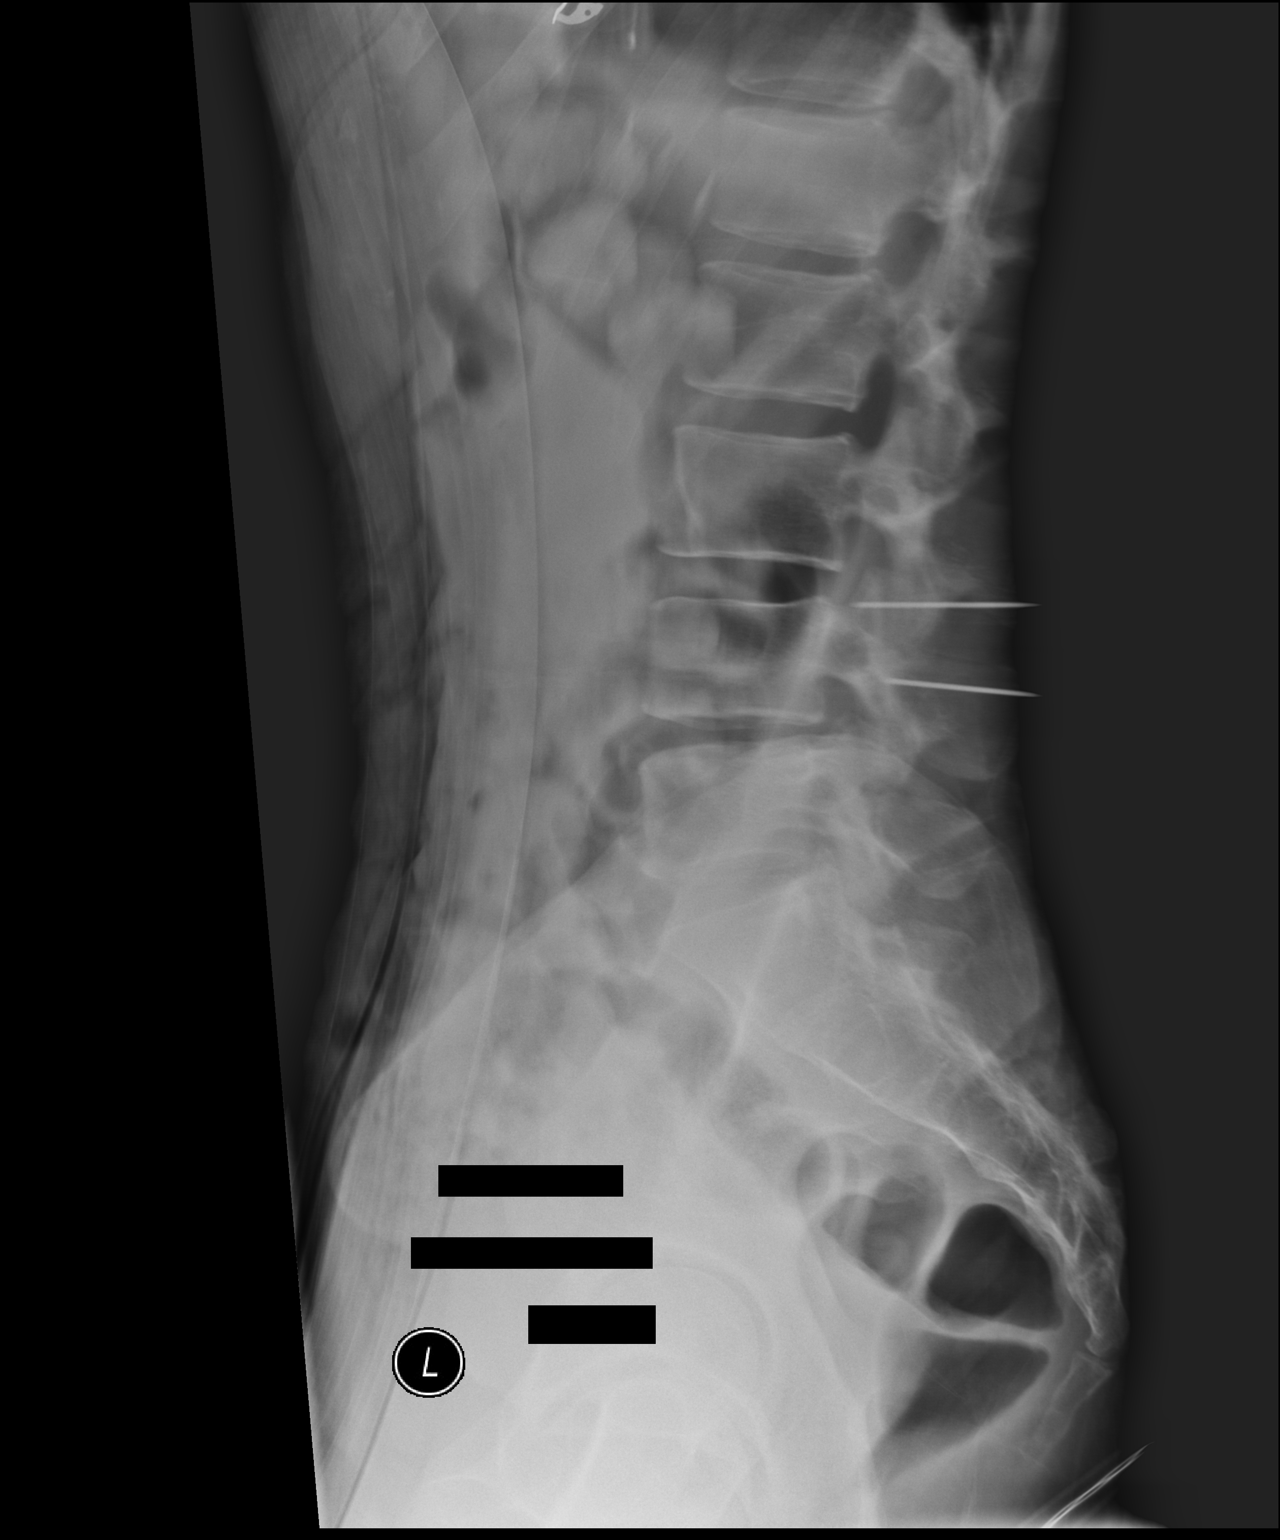

[xtable lateral (2 of 2)]
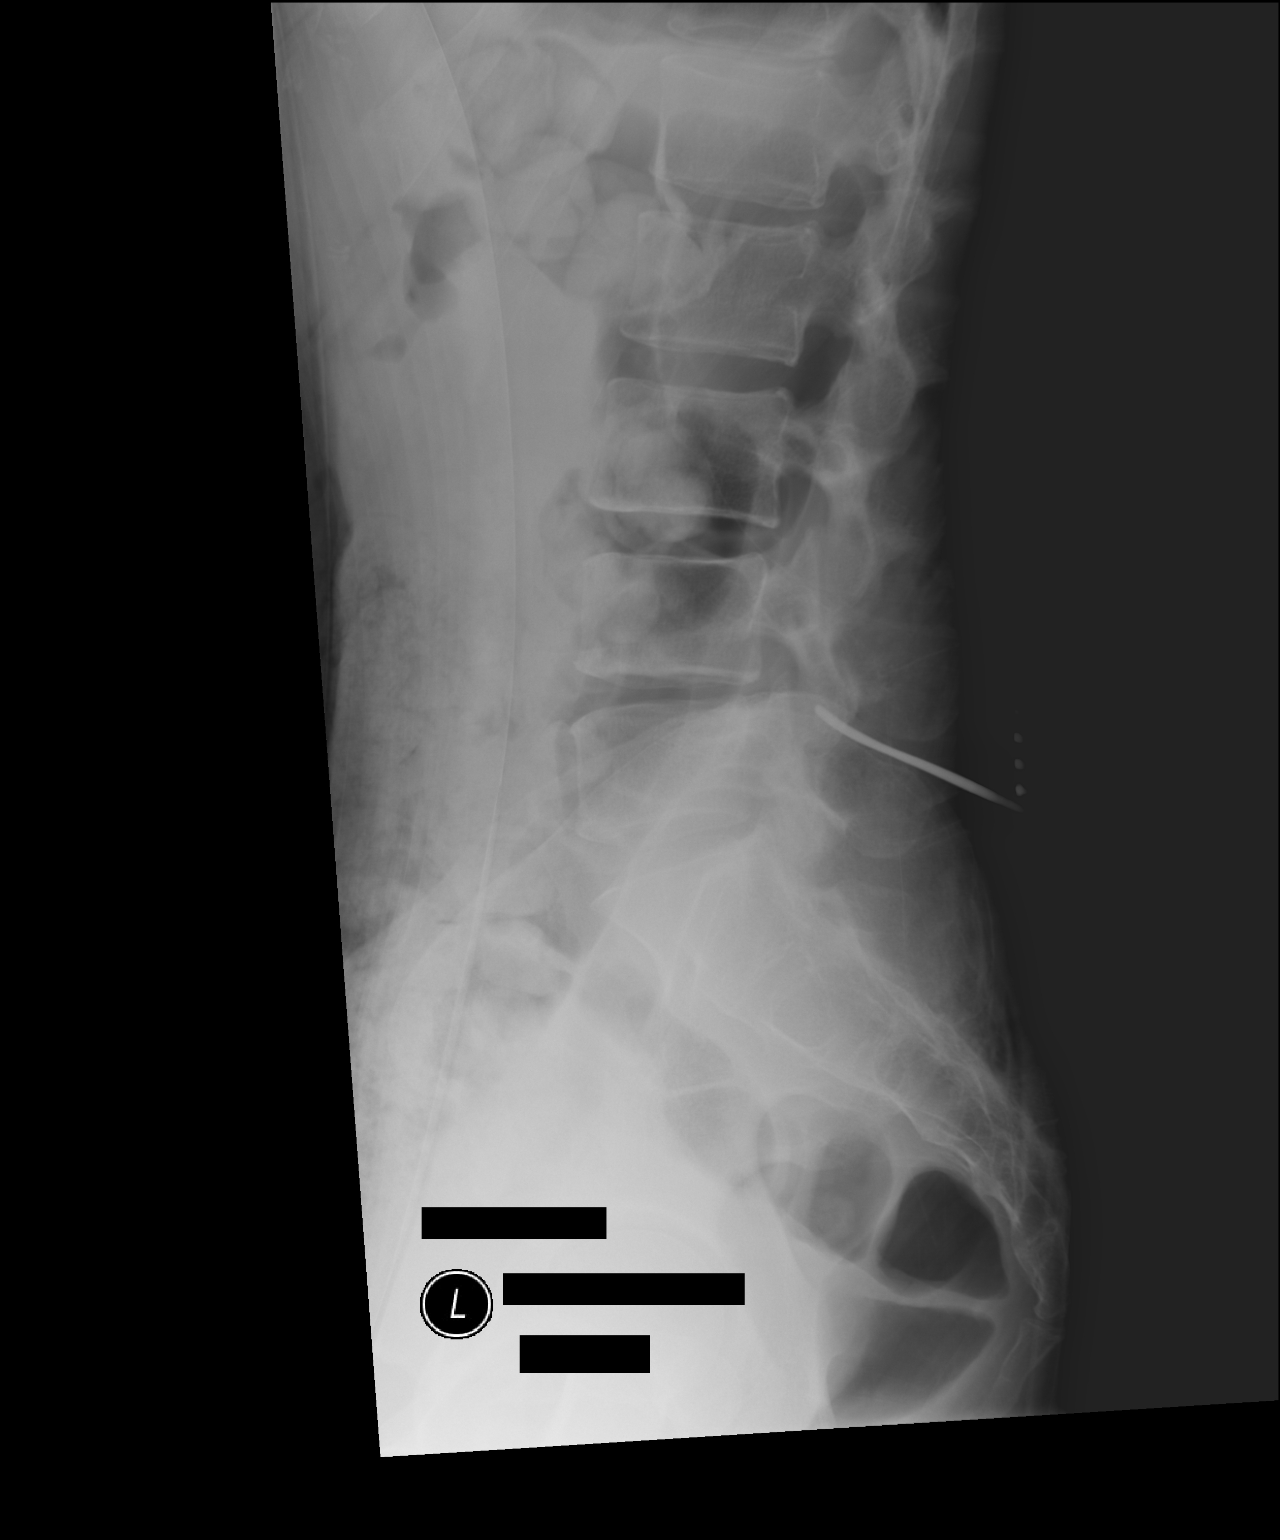

[2 of 2 positions shown; findings below may reference images not displayed]

FINDINGS: Two lateral images lumbar spine were obtained

Image number 1 demonstrates a localizer needle overlying the spinal
canal at the L3-4 level. A second needle is located posterior to the
L4 pedicle.

Image number 2 demonstrates a surgical instrument under the L4
lamina at the L4-5 disc space.
IMPRESSION: L4-5 disc space localized in the operating room.

These results were called by telephone at the time of interpretation
on 06/19/2020 at [DATE] to provider Nielsen in the OR, who verbally
acknowledged these results.

## 2022-11-24 DIAGNOSIS — F411 Generalized anxiety disorder: Secondary | ICD-10-CM | POA: Diagnosis not present

## 2023-01-31 ENCOUNTER — Emergency Department (HOSPITAL_COMMUNITY)
Admission: EM | Admit: 2023-01-31 | Discharge: 2023-01-31 | Disposition: A | Discharge status: Home / Self Care | Payer: BC Managed Care – PPO | Attending: Emergency Medicine | Admitting: Emergency Medicine

## 2023-01-31 ENCOUNTER — Other Ambulatory Visit: Payer: Self-pay

## 2023-01-31 ENCOUNTER — Encounter (HOSPITAL_COMMUNITY): Payer: Self-pay

## 2023-01-31 DIAGNOSIS — W260XXA Contact with knife, initial encounter: Secondary | ICD-10-CM | POA: Diagnosis not present

## 2023-01-31 DIAGNOSIS — S61412A Laceration without foreign body of left hand, initial encounter: Secondary | ICD-10-CM | POA: Insufficient documentation

## 2023-01-31 DIAGNOSIS — S6992XA Unspecified injury of left wrist, hand and finger(s), initial encounter: Secondary | ICD-10-CM | POA: Diagnosis not present

## 2023-01-31 MED ORDER — LIDOCAINE-EPINEPHRINE (PF) 2 %-1:200000 IJ SOLN
10.0000 mL | Freq: Once | INTRAMUSCULAR | Status: AC
  Administered 2023-01-31: 10 mL via INTRADERMAL
  Filled 2023-01-31: qty 20

## 2023-01-31 NOTE — ED Provider Notes (Signed)
[place] [facility] AT [facility] Provider Note   CSN: [id number] Arrival date & time: 01/31/23  0018     History  Chief Complaint  Patient presents with   Extremity Laceration    Left    Linda Moody is a 42 y.o. female who presents with concern for laceration to the left thumb.  States she was cutting an avocado when the knife slipped and she stabbed her hand.  Minimal bleeding.  Patient is not up-to-date on tetanus vaccination.  HPI     Home Medications Prior to Admission medications   Medication Sig Start Date End Date Taking? Authorizing Provider  bismuth subsalicylate (PEPTO BISMOL) 262 MG/15ML suspension Take 30 mLs by mouth every 6 (six) hours as needed for indigestion.    [provider]  clonazePAM (KLONOPIN) 0.5 MG tablet Take 0.5 mg by mouth 2 (two) times daily as needed for anxiety.    [provider]  FLUoxetine (PROZAC) 20 MG tablet Take 20 mg by mouth daily.    [provider]  Ketotifen Fumarate (ALLERGY EYE DROPS OP) Place 1 drop into both eyes daily as needed (itching).    [provider]  ondansetron (ZOFRAN) 4 MG tablet Take 1 tablet (4 mg total) by mouth every 8 (eight) hours as needed for nausea or vomiting. 06/19/20   Venita Lick, MD      Allergies    Aspirin and Penicillins    Review of Systems   Review of Systems  Skin:  Positive for wound.    Physical Exam Updated Vital Signs BP (!) 141/93 (BP Location: Right Arm)   Pulse 94   Temp 98.6 F (37 C) (Oral)   Resp 17   Ht 5\' 4"  (1.626 m)   Wt 55.3 kg   SpO2 100%   BMI 20.94 kg/m  Physical Exam Vitals and nursing note reviewed.  Constitutional:      Appearance: She is not ill-appearing or toxic-appearing.  HENT:     Head: Normocephalic and atraumatic.  Eyes:     General: No scleral icterus.       Right eye: No discharge.        Left eye: No discharge.     Conjunctiva/sclera: Conjunctivae normal.  Cardiovascular:      Heart sounds: Normal heart sounds. No murmur heard. Pulmonary:     Effort: Pulmonary effort is normal.  Musculoskeletal:       Hands:  Skin:    General: Skin is warm and dry.     Capillary Refill: Capillary refill takes less than 2 seconds.     Findings: Laceration present.  Neurological:     General: No focal deficit present.     Mental Status: She is alert.  Psychiatric:        Mood and Affect: Mood normal.     ED Results / Procedures / Treatments   Labs (all labs ordered are listed, but only abnormal results are displayed) Labs Reviewed - No data to display  EKG None  Radiology No results found.  Procedures .Marland KitchenLaceration Repair  Date/Time: 01/31/2023 4:21 AM  Performed by: Paris Lore, PA-C Authorized by: Paris Lore, PA-C   Consent:    Consent obtained:  Verbal   Consent given by:  Patient   Risks discussed:  Infection, pain, poor cosmetic result, nerve damage and need for additional repair   Alternatives discussed:  No treatment and delayed treatment Universal protocol:    Patient identity confirmed:  Verbally  with patient Anesthesia:    Anesthesia method:  Local infiltration   Local anesthetic:  Lidocaine 2% WITH epi Laceration details:    Location:  Hand   Hand location:  L palm   Length (cm):  1 Pre-procedure details:    Preparation:  Patient was prepped and draped in usual sterile fashion Exploration:    Hemostasis achieved with:  Epinephrine Treatment:    Irrigation solution:  Sterile saline Skin repair:    Repair method:  Sutures   Suture size:  4-0   Suture material:  Prolene   Suture technique:  Simple interrupted   Number of sutures:  1 Approximation:    Approximation:  Close Repair type:    Repair type:  Simple Post-procedure details:    Dressing:  Antibiotic ointment and non-adherent dressing     Medications Ordered in ED Medications  lidocaine-EPINEPHrine (XYLOCAINE W/EPI) 2 %-1:200000 (PF) injection 10 mL (10  mLs Intradermal Given by Other 01/31/23 1610)    ED Course/ Medical Decision Making/ A&P                                 Medical Decision Making 42 year old female with laceration of left palm.  Vital signs normal on intake.  Cardiopulmonary exams unremarkable, laceration to the left hand as above.  Risk Prescription drug management.   Wound repaired as above. Patient refused tetanus update.  No antibiotics warranted as wound was not contaminated.  Anja voiced understanding of her medical evaluation and treatment plan. Each of their questions answered to their expressed satisfaction.  Return precautions were given.  Patient is well-appearing, stable, and was discharged in good condition.  This chart was dictated using voice recognition software, Dragon. Despite the best efforts of this provider to proofread and correct errors, errors may still occur which can change documentation meaning.  Final Clinical Impression(s) / ED Diagnoses Final diagnoses:  Laceration of left hand, foreign body presence unspecified, initial encounter    Rx / DC Orders ED Discharge Orders     None         Sherrilee Gilles 01/31/23 0424    Palumbo, April, MD 01/31/23 [phone]

## 2023-01-31 NOTE — ED Triage Notes (Signed)
Arrived by POV. Pt has left palm laceration after trying to cut an avocado pit out. Cut her hand with a kitchen knife. Bleeding controlled in triage. Last tetanus shot was greater than 10 years ago.

## 2023-01-31 NOTE — Discharge Instructions (Signed)
Your laceration was repaired with a single suture which will need to be removed in 7-10 days. Keep the wound clean and dry and follow-up with your primary care doctor.  Return to the ER with any severe symptoms.

## 2023-03-05 DIAGNOSIS — Z3202 Encounter for pregnancy test, result negative: Secondary | ICD-10-CM | POA: Diagnosis not present

## 2023-03-05 DIAGNOSIS — Z3046 Encounter for surveillance of implantable subdermal contraceptive: Secondary | ICD-10-CM | POA: Diagnosis not present

## 2023-03-05 DIAGNOSIS — G43829 Menstrual migraine, not intractable, without status migrainosus: Secondary | ICD-10-CM | POA: Diagnosis not present

## 2023-08-17 DIAGNOSIS — Z01419 Encounter for gynecological examination (general) (routine) without abnormal findings: Secondary | ICD-10-CM | POA: Diagnosis not present

## 2023-08-17 DIAGNOSIS — Z124 Encounter for screening for malignant neoplasm of cervix: Secondary | ICD-10-CM | POA: Diagnosis not present

## 2023-08-30 DIAGNOSIS — Z6822 Body mass index (BMI) 22.0-22.9, adult: Secondary | ICD-10-CM | POA: Diagnosis not present

## 2023-08-30 DIAGNOSIS — F411 Generalized anxiety disorder: Secondary | ICD-10-CM | POA: Diagnosis not present

## 2023-08-30 DIAGNOSIS — G43909 Migraine, unspecified, not intractable, without status migrainosus: Secondary | ICD-10-CM | POA: Diagnosis not present

## 2023-09-10 DIAGNOSIS — M5126 Other intervertebral disc displacement, lumbar region: Secondary | ICD-10-CM | POA: Diagnosis not present

## 2023-09-10 DIAGNOSIS — M25511 Pain in right shoulder: Secondary | ICD-10-CM | POA: Diagnosis not present

## 2023-09-24 DIAGNOSIS — M25511 Pain in right shoulder: Secondary | ICD-10-CM | POA: Diagnosis not present

## 2023-09-29 DIAGNOSIS — M25511 Pain in right shoulder: Secondary | ICD-10-CM | POA: Diagnosis not present

## 2023-10-13 DIAGNOSIS — M25511 Pain in right shoulder: Secondary | ICD-10-CM | POA: Diagnosis not present

## 2023-12-21 DIAGNOSIS — M25511 Pain in right shoulder: Secondary | ICD-10-CM | POA: Diagnosis not present

## 2024-05-09 DIAGNOSIS — M47816 Spondylosis without myelopathy or radiculopathy, lumbar region: Secondary | ICD-10-CM | POA: Diagnosis not present

## 2024-05-09 DIAGNOSIS — M5126 Other intervertebral disc displacement, lumbar region: Secondary | ICD-10-CM | POA: Diagnosis not present

## 2024-05-24 DIAGNOSIS — M5459 Other low back pain: Secondary | ICD-10-CM | POA: Diagnosis not present

## 2024-05-29 DIAGNOSIS — M5126 Other intervertebral disc displacement, lumbar region: Secondary | ICD-10-CM | POA: Diagnosis not present

## 2024-05-29 DIAGNOSIS — M47816 Spondylosis without myelopathy or radiculopathy, lumbar region: Secondary | ICD-10-CM | POA: Diagnosis not present
